# Patient Record
Sex: Male | Born: 1950 | Race: White | Hispanic: No | Marital: Married | Smoking: Never smoker
Health system: Southern US, Community
[De-identification: ages and names within clinical notes are randomized; demographics above are authoritative.]

## PROBLEM LIST (undated history)

## (undated) DIAGNOSIS — G4730 Sleep apnea, unspecified: Secondary | ICD-10-CM

## (undated) DIAGNOSIS — H9190 Unspecified hearing loss, unspecified ear: Secondary | ICD-10-CM

## (undated) DIAGNOSIS — I10 Essential (primary) hypertension: Secondary | ICD-10-CM

## (undated) DIAGNOSIS — K219 Gastro-esophageal reflux disease without esophagitis: Secondary | ICD-10-CM

## (undated) DIAGNOSIS — T7840XA Allergy, unspecified, initial encounter: Secondary | ICD-10-CM

## (undated) DIAGNOSIS — M1990 Unspecified osteoarthritis, unspecified site: Secondary | ICD-10-CM

## (undated) DIAGNOSIS — R5383 Other fatigue: Secondary | ICD-10-CM

## (undated) DIAGNOSIS — C801 Malignant (primary) neoplasm, unspecified: Secondary | ICD-10-CM

## (undated) DIAGNOSIS — E785 Hyperlipidemia, unspecified: Secondary | ICD-10-CM

## (undated) HISTORY — DX: Essential (primary) hypertension: I10

## (undated) HISTORY — PX: PLANTAR FASCIA SURGERY: SHX746

## (undated) HISTORY — DX: Sleep apnea, unspecified: G47.30

## (undated) HISTORY — DX: Hyperlipidemia, unspecified: E78.5

## (undated) HISTORY — DX: Unspecified hearing loss, unspecified ear: H91.90

## (undated) HISTORY — DX: Allergy, unspecified, initial encounter: T78.40XA

## (undated) HISTORY — PX: VASECTOMY: SHX75

## (undated) HISTORY — DX: Malignant (primary) neoplasm, unspecified: C80.1

## (undated) HISTORY — DX: Gastro-esophageal reflux disease without esophagitis: K21.9

## (undated) HISTORY — DX: Other fatigue: R53.83

---

## 1974-01-23 HISTORY — PX: HERNIA REPAIR: SHX51

## 1997-01-23 HISTORY — PX: TOTAL THYROIDECTOMY: SHX2547

## 1999-01-24 HISTORY — PX: TREATMENT FISTULA ANAL: SUR1390

## 2012-01-24 DIAGNOSIS — C801 Malignant (primary) neoplasm, unspecified: Secondary | ICD-10-CM

## 2012-01-24 HISTORY — DX: Malignant (primary) neoplasm, unspecified: C80.1

## 2012-10-07 HISTORY — PX: OTHER SURGICAL HISTORY: SHX169

## 2013-02-03 DIAGNOSIS — R252 Cramp and spasm: Secondary | ICD-10-CM | POA: Insufficient documentation

## 2013-02-03 DIAGNOSIS — C73 Malignant neoplasm of thyroid gland: Secondary | ICD-10-CM | POA: Insufficient documentation

## 2016-07-13 DIAGNOSIS — M545 Low back pain, unspecified: Secondary | ICD-10-CM | POA: Insufficient documentation

## 2016-07-14 DIAGNOSIS — M5416 Radiculopathy, lumbar region: Secondary | ICD-10-CM | POA: Insufficient documentation

## 2017-01-23 HISTORY — PX: SPINE SURGERY: SHX786

## 2017-03-06 DIAGNOSIS — E559 Vitamin D deficiency, unspecified: Secondary | ICD-10-CM | POA: Insufficient documentation

## 2017-05-10 DIAGNOSIS — M47816 Spondylosis without myelopathy or radiculopathy, lumbar region: Secondary | ICD-10-CM | POA: Insufficient documentation

## 2017-07-04 DIAGNOSIS — M48061 Spinal stenosis, lumbar region without neurogenic claudication: Secondary | ICD-10-CM | POA: Insufficient documentation

## 2018-04-17 ENCOUNTER — Ambulatory Visit: Payer: Self-pay | Admitting: General Surgery

## 2018-04-26 DIAGNOSIS — Z6841 Body Mass Index (BMI) 40.0 and over, adult: Secondary | ICD-10-CM | POA: Insufficient documentation

## 2018-04-26 DIAGNOSIS — K219 Gastro-esophageal reflux disease without esophagitis: Secondary | ICD-10-CM | POA: Insufficient documentation

## 2018-04-26 DIAGNOSIS — I447 Left bundle-branch block, unspecified: Secondary | ICD-10-CM | POA: Insufficient documentation

## 2018-04-26 DIAGNOSIS — R0609 Other forms of dyspnea: Secondary | ICD-10-CM | POA: Insufficient documentation

## 2018-04-26 DIAGNOSIS — I2510 Atherosclerotic heart disease of native coronary artery without angina pectoris: Secondary | ICD-10-CM | POA: Insufficient documentation

## 2018-05-01 ENCOUNTER — Other Ambulatory Visit: Payer: Self-pay

## 2018-05-01 ENCOUNTER — Encounter: Payer: Self-pay | Admitting: General Surgery

## 2018-05-01 ENCOUNTER — Ambulatory Visit (INDEPENDENT_AMBULATORY_CARE_PROVIDER_SITE_OTHER): Payer: Medicare Other | Admitting: General Surgery

## 2018-05-01 VITALS — BP 145/78 | HR 64 | Temp 97.9°F | Resp 14 | Ht 70.0 in | Wt 283.4 lb

## 2018-05-01 DIAGNOSIS — Z85850 Personal history of malignant neoplasm of thyroid: Secondary | ICD-10-CM

## 2018-05-01 NOTE — Progress Notes (Signed)
Patient ID: Chase Ellis, male   DOB: 1950-11-20, 68 y.o.   MRN: [id number]  Chief Complaint  Patient presents with  . Follow-up    thyroid cancer    HPI Chase Ellis is a 68 y.o. male.   He is a patient of mine from my previous institution.  He has a history of thyroid cancer.  He underwent total thyroidectomy with Dr. Zena Amos in 1999.  He had a local recurrence which I resected in 2014.  He had been managed since that operation without any evidence of recurrence.  His TSH was kept in the low- to just below low-normal range.  He has had very low level, detectable thyroglobulin, in the absence of antithyroglobulin antibodies.  At the time of his recurrence in 2014, the tumor was non-iodine avid.  It was, however, PET avid.  Since my departure from [facility], he has been seen by Dr. Cam Hai, who apparently felt that he did not require any further follow-up in endocrine surgery.  He was referred to see Dr. Halford Chessman in endocrinology, but has not yet had that appointment.  For about the last year, he has been evaluated for a chronic cough and elevated white blood cell count.  He has been treated with multiple rounds of antibiotics and steroids.  He was recently seen at [facility] in the oncology department by Dr. Ellwood Handler, for further evaluation of his elevated white count.  I do not have the full details of the evaluation and management, but both [name] and his wife state that his white count is better, but that his cough persists.  Some of this seems to have been attributed to an ACE inhibitor, which has been changed.  Nonetheless, he continues to have a cough throughout the day.  It is not present at night.  He was seen by Dr. Silvio Clayman in otolaryngology who did not find evidence of any upper airway disease, but stated in his note that he would refer him to laryngology for further evaluation.  I do not see that that visit ever transpired.  In the interim however, he had  several skin nodules crop up.  These were removed with a shave biopsy by a dermatologist local to him.  1 was consistent with irritated verrucoid keratosis; 1 was consistent with irritated seborrheic keratosis; the third, found in the right anterior neck was consistent with metastatic thyroid carcinoma.  With these results, Dr. Ellwood Handler referred him to see Dr. Fredricka Bonine, and otolaryngology at [facility].  Dr. Francine Graven note indicates that there was a small lesion in the area where Dr. Kathyrn Lass had left a drain many years ago.  He was concerned that this may represent additional sites of metastatic cutaneous spread.  This was removed and fortunately returned as benign.  No further notes from Dr. Conley Canal discussed the plan from that point forward, but [name] and his wife indicated that he had discussed with them a wide local excision of the skin surrounding his thyroidectomy scar as well as excision of an area within the right thyroidectomy bed that had been seen on previous imaging, but not biopsied.  Mr. Franchini and his wife felt that they would prefer to see me.  They were able to locate me using Google and made the appointment for today.  [name] denies any symptoms of thyroid excess or insufficiency.  Specifically he denies any heart palpitations, hand tremors, changes in the texture of his hair skin or fingernails.  He has not had any significant weight changes.  No diarrhea or constipation.  No heat or cold intolerance.  He is currently taking 175 mcg of levothyroxine daily.  His most recent labs demonstrate a free T4 of 1.4, thyroglobulin 0.2 with undetectable antithyroglobulin antibodies.  TSH is suppressed at 0.132.    Past Medical History:  Diagnosis Date  . Allergy   . Cancer [facility]) 2014   Thyroid  . Fatigue   . GERD (gastroesophageal reflux disease)   . Hearing loss   . Hyperlipidemia   . Hypertension   . Sleep apnea    uses CPAP    Past Surgical History:  Procedure  Laterality Date  . HERNIA REPAIR Right 1976   Inguinal  . lymph node dissecton cervical  10/07/2012  . PLANTAR FASCIA SURGERY Bilateral   . SPINE SURGERY  2019   lumbar  . TOTAL THYROIDECTOMY  [facility]   [facility]  . TREATMENT FISTULA ANAL  2001  . VASECTOMY      Family History  Problem Relation Age of Onset  . Thyroid disease Mother   . Hypertension Mother   . Stroke Father   . Heart attack Father   . Bladder Cancer Father   . Diabetes Father   . Heart attack Sister   . Diabetes Sister   . Hypercholesterolemia Sister   . Hypertension Sister     Social History Social History   Tobacco Use  . Smoking status: Never Smoker  . Smokeless tobacco: Former Network engineer Use Topics  . Alcohol use: Never    Frequency: Never  . Drug use: Never    Allergies  Allergen Reactions  . Other     Deer  . Valsartan Other (See Comments)    Cough     Current Outpatient Medications  Medication Sig Dispense Refill  . azelastine (ASTELIN) 0.1 % nasal spray U 2 SPRAYS IEN BID    . Cholecalciferol (VITAMIN D-1000 MAX ST) 25 MCG (1000 UT) tablet Take by mouth.    . fluticasone (FLONASE) 50 MCG/ACT nasal spray Place into both nostrils daily.    . hydrochlorothiazide (HYDRODIURIL) 25 MG tablet TK 1 T PO QD    . levothyroxine (SYNTHROID, LEVOTHROID) 175 MCG tablet Take 175 mcg by mouth daily before breakfast.    . metoprolol succinate (TOPROL-XL) 25 MG 24 hr tablet Take by mouth.    Marland Kitchen omeprazole (PRILOSEC) 40 MG capsule TK 1 C PO QD    . potassium chloride SA (K-DUR,KLOR-CON) 20 MEQ tablet Take by mouth.    . rosuvastatin (CRESTOR) 5 MG tablet      No current facility-administered medications for this visit.     Review of Systems Review of Systems  All other systems reviewed and are negative. Or as discussed in the history of present illness.   Blood pressure (!) 145/78, pulse 64, temperature 97.9 F (36.6 C), resp. rate 14, height 5\' 10"  (1.778 m), weight 283 lb 6.4  oz (128.5 kg), SpO2 96 %.  Physical Exam Physical Exam Vitals signs reviewed.  Constitutional:      General: He is not in acute distress.    Appearance: Normal appearance. He is obese.  HENT:     Head: Normocephalic and atraumatic.     Nose: No congestion or rhinorrhea.     Mouth/Throat:     Mouth: Mucous membranes are moist.  Eyes:     General: No scleral icterus.       Right eye: No  discharge.        Left eye: No discharge.     Comments: No proptosis or exophthalmos  Neck:     Musculoskeletal: Normal range of motion.     Comments: Thyroidectomy scar is well-healed and faded.  No masses are palpable in the thyroidectomy bed.  There is a small cutaneous lump approximately a centimeter cranial to the thyroidectomy scar.  Mr. Yamada states that this appears similar to the lesion that was shaved from off of the thyroid scar and turned out to be thyroid carcinoma. Cardiovascular:     Rate and Rhythm: Normal rate and regular rhythm.  Pulmonary:     Effort: Pulmonary effort is normal.  Abdominal:     Palpations: Abdomen is soft.  Genitourinary:    Comments: Deferred. Musculoskeletal: Normal range of motion.     Right lower leg: No edema.     Left lower leg: No edema.  Lymphadenopathy:     Cervical: No cervical adenopathy.  Skin:    General: Skin is warm and dry.     Coloration: Skin is not jaundiced.  Neurological:     General: No focal deficit present.     Mental Status: He is alert and oriented to person, place, and time.  Psychiatric:        Mood and Affect: Mood normal.        Behavior: Behavior normal.     Data Reviewed I extensively reviewed the notes from his recent experiences at [facility].  These are summarized in the history of present illness.  We were also able to obtain the biopsy report from his dermatologist.  This is also reviewed in the history of present illness.  His labs were reviewed in care everywhere and also discussed in the  history of present illness.  Assessment and Plan: This is a 68 year old man who had an initial thyroidectomy for thyroid cancer in 1999 followed by radioactive iodine ablation.  He had a recurrence in the strap muscles in 2014.  This recurrence was non-iodine avid, but was PET avid.  He recently had a shave biopsy of a skin lesion that was consistent with cutaneous metastasis of thyroid carcinoma.  There is also an area within the right thyroidectomy bed that may represent recurrence as well.  This has been seen on previous studies but was not seen on PET scan in 2014.  Chest CT performed to evaluate for cough also showed multiple small pulmonary nodules.  He is scheduled for a repeat study in May.  Before proceeding with any operative intervention, I would like to obtain a PET scan from the skull base to the mid thighs.  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  We have ordered this.  I will contact him when I have these results and we will make plans at that time.      Fredirick Maudlin 05/01/2018, 1:36 PM

## 2018-05-01 NOTE — Patient Instructions (Addendum)
You are scheduled for a PET scan at [facility] on 05/09/18 at 10:30 am.  You will need to arrive there by 10:00 am. You will need to have nothing by mouth after midnight the night prior.  You will go to the [place] entrance and only yourself may go in due to the Covid 19 virus.  We will call you with the results.

## 2018-05-08 ENCOUNTER — Ambulatory Visit: Payer: Self-pay | Admitting: General Surgery

## 2018-05-09 ENCOUNTER — Encounter
Admission: RE | Admit: 2018-05-09 | Discharge: 2018-05-09 | Disposition: A | Discharge status: Home / Self Care | Payer: Medicare Other | Source: Ambulatory Visit | Attending: General Surgery | Admitting: General Surgery

## 2018-05-09 ENCOUNTER — Other Ambulatory Visit: Payer: Self-pay

## 2018-05-09 DIAGNOSIS — Z79899 Other long term (current) drug therapy: Secondary | ICD-10-CM | POA: Insufficient documentation

## 2018-05-09 DIAGNOSIS — Z85850 Personal history of malignant neoplasm of thyroid: Secondary | ICD-10-CM | POA: Diagnosis not present

## 2018-05-09 LAB — GLUCOSE, CAPILLARY: Glucose-Capillary: 82 mg/dL (ref 70–99)

## 2018-05-09 MED ORDER — FLUDEOXYGLUCOSE F - 18 (FDG) INJECTION
15.8000 | Freq: Once | INTRAVENOUS | Status: AC | PRN
  Administered 2018-05-09: 15.8 via INTRAVENOUS

## 2018-05-15 ENCOUNTER — Telehealth: Payer: Self-pay | Admitting: General Surgery

## 2018-05-15 NOTE — Telephone Encounter (Signed)
Discussed results of PET scan.  No uptake within neck/thyroid bed.  Will plan re-excision of scar (area where cutaneous met showed up) and biopsy the other skin lesion on his neck.

## 2018-06-04 ENCOUNTER — Telehealth: Payer: Self-pay | Admitting: *Deleted

## 2018-06-04 ENCOUNTER — Other Ambulatory Visit: Payer: Self-pay | Admitting: General Surgery

## 2018-06-04 DIAGNOSIS — C799 Secondary malignant neoplasm of unspecified site: Secondary | ICD-10-CM

## 2018-06-04 DIAGNOSIS — C73 Malignant neoplasm of thyroid gland: Secondary | ICD-10-CM

## 2018-06-04 NOTE — Telephone Encounter (Signed)
Patient contacted and agreeable to having surgery at [facility].   The patient is scheduled for 06-10-18 with Dr. Celine Ahr.   Patient aware to have COVID-19 testing on 06-06-18 between 10:30 and 12:30 am at the [facility] building drive thru.   Patient aware to be NPO after midnight and have a driver.   Patient aware that he may have no visitors and driver will need to wait in the car due to COVID-19 restrictions.   The patient verbalizes understanding of the above.   The patient is aware to call the office should he have further questions.

## 2018-06-05 ENCOUNTER — Encounter: Payer: Self-pay | Admitting: *Deleted

## 2018-06-05 ENCOUNTER — Other Ambulatory Visit: Payer: Self-pay

## 2018-06-06 ENCOUNTER — Other Ambulatory Visit
Admission: RE | Admit: 2018-06-06 | Discharge: 2018-06-06 | Disposition: A | Discharge status: Home / Self Care | Payer: Medicare Other | Source: Ambulatory Visit | Attending: General Surgery | Admitting: General Surgery

## 2018-06-06 DIAGNOSIS — Z1159 Encounter for screening for other viral diseases: Secondary | ICD-10-CM | POA: Insufficient documentation

## 2018-06-07 LAB — NOVEL CORONAVIRUS, NAA (HOSP ORDER, SEND-OUT TO REF LAB; TAT 18-24 HRS): SARS-CoV-2, NAA: NOT DETECTED

## 2018-06-09 NOTE — Discharge Instructions (Signed)
General Anesthesia, Adult, Care After  This sheet gives you information about how to care for yourself after your procedure. Your health care provider may also give you more specific instructions. If you have problems or questions, contact your health care provider.  What can I expect after the procedure?  After the procedure, the following side effects are common:  Pain or discomfort at the IV site.  Nausea.  Vomiting.  Sore throat.  Trouble concentrating.  Feeling cold or chills.  Weak or tired.  Sleepiness and fatigue.  Soreness and body aches. These side effects can affect parts of the body that were not involved in surgery.  Follow these instructions at home:    For at least 24 hours after the procedure:  Have a responsible adult stay with you. It is important to have someone help care for you until you are awake and alert.  Rest as needed.  Do not:  Participate in activities in which you could fall or become injured.  Drive.  Use heavy machinery.  Drink alcohol.  Take sleeping pills or medicines that cause drowsiness.  Make important decisions or sign legal documents.  Take care of children on your own.  Eating and drinking  Follow any instructions from your health care provider about eating or drinking restrictions.  When you feel hungry, start by eating small amounts of foods that are soft and easy to digest (bland), such as toast. Gradually return to your regular diet.  Drink enough fluid to keep your urine pale yellow.  If you vomit, rehydrate by drinking water, juice, or clear broth.  General instructions  If you have sleep apnea, surgery and certain medicines can increase your risk for breathing problems. Follow instructions from your health care provider about wearing your sleep device:  Anytime you are sleeping, including during daytime naps.  While taking prescription pain medicines, sleeping medicines, or medicines that make you drowsy.  Return to your normal activities as told by your health care  provider. Ask your health care provider what activities are safe for you.  Take over-the-counter and prescription medicines only as told by your health care provider.  If you smoke, do not smoke without supervision.  Keep all follow-up visits as told by your health care provider. This is important.  Contact a health care provider if:  You have nausea or vomiting that does not get better with medicine.  You cannot eat or drink without vomiting.  You have pain that does not get better with medicine.  You are unable to pass urine.  You develop a skin rash.  You have a fever.  You have redness around your IV site that gets worse.  Get help right away if:  You have difficulty breathing.  You have chest pain.  You have blood in your urine or stool, or you vomit blood.  Summary  After the procedure, it is common to have a sore throat or nausea. It is also common to feel tired.  Have a responsible adult stay with you for the first 24 hours after general anesthesia. It is important to have someone help care for you until you are awake and alert.  When you feel hungry, start by eating small amounts of foods that are soft and easy to digest (bland), such as toast. Gradually return to your regular diet.  Drink enough fluid to keep your urine pale yellow.  Return to your normal activities as told by your health care provider. Ask your health care   provider what activities are safe for you.  This information is not intended to replace advice given to you by your health care provider. Make sure you discuss any questions you have with your health care provider.  Document Released: 04/17/2000 Document Revised: 08/25/2016 Document Reviewed: 08/25/2016  Elsevier Interactive Patient Education  2019 [employer]

## 2018-06-10 ENCOUNTER — Ambulatory Visit: Payer: Medicare Other | Admitting: Anesthesiology

## 2018-06-10 ENCOUNTER — Ambulatory Visit
Admission: RE | Admit: 2018-06-10 | Discharge: 2018-06-10 | Disposition: A | Discharge status: Home / Self Care | Payer: Medicare Other | Attending: General Surgery | Admitting: General Surgery

## 2018-06-10 ENCOUNTER — Encounter
Admission: RE | Disposition: A | Discharge status: Home / Self Care | Payer: Self-pay | Source: Home / Self Care | Attending: General Surgery

## 2018-06-10 DIAGNOSIS — Z8249 Family history of ischemic heart disease and other diseases of the circulatory system: Secondary | ICD-10-CM | POA: Insufficient documentation

## 2018-06-10 DIAGNOSIS — R221 Localized swelling, mass and lump, neck: Secondary | ICD-10-CM | POA: Diagnosis not present

## 2018-06-10 DIAGNOSIS — Z6839 Body mass index (BMI) 39.0-39.9, adult: Secondary | ICD-10-CM | POA: Insufficient documentation

## 2018-06-10 DIAGNOSIS — C799 Secondary malignant neoplasm of unspecified site: Secondary | ICD-10-CM

## 2018-06-10 DIAGNOSIS — Z79890 Hormone replacement therapy (postmenopausal): Secondary | ICD-10-CM | POA: Diagnosis not present

## 2018-06-10 DIAGNOSIS — K219 Gastro-esophageal reflux disease without esophagitis: Secondary | ICD-10-CM | POA: Insufficient documentation

## 2018-06-10 DIAGNOSIS — C7989 Secondary malignant neoplasm of other specified sites: Secondary | ICD-10-CM

## 2018-06-10 DIAGNOSIS — Z85850 Personal history of malignant neoplasm of thyroid: Secondary | ICD-10-CM | POA: Diagnosis not present

## 2018-06-10 DIAGNOSIS — Z79899 Other long term (current) drug therapy: Secondary | ICD-10-CM | POA: Diagnosis not present

## 2018-06-10 DIAGNOSIS — E785 Hyperlipidemia, unspecified: Secondary | ICD-10-CM | POA: Insufficient documentation

## 2018-06-10 DIAGNOSIS — C792 Secondary malignant neoplasm of skin: Secondary | ICD-10-CM | POA: Insufficient documentation

## 2018-06-10 DIAGNOSIS — E669 Obesity, unspecified: Secondary | ICD-10-CM | POA: Diagnosis not present

## 2018-06-10 DIAGNOSIS — L739 Follicular disorder, unspecified: Secondary | ICD-10-CM | POA: Diagnosis not present

## 2018-06-10 DIAGNOSIS — G4730 Sleep apnea, unspecified: Secondary | ICD-10-CM | POA: Insufficient documentation

## 2018-06-10 DIAGNOSIS — E890 Postprocedural hypothyroidism: Secondary | ICD-10-CM | POA: Diagnosis not present

## 2018-06-10 DIAGNOSIS — I10 Essential (primary) hypertension: Secondary | ICD-10-CM | POA: Insufficient documentation

## 2018-06-10 DIAGNOSIS — M1990 Unspecified osteoarthritis, unspecified site: Secondary | ICD-10-CM | POA: Diagnosis not present

## 2018-06-10 DIAGNOSIS — C73 Malignant neoplasm of thyroid gland: Secondary | ICD-10-CM

## 2018-06-10 DIAGNOSIS — L905 Scar conditions and fibrosis of skin: Secondary | ICD-10-CM | POA: Diagnosis not present

## 2018-06-10 HISTORY — DX: Unspecified osteoarthritis, unspecified site: M19.90

## 2018-06-10 HISTORY — PX: EXCISION MASS NECK: SHX6703

## 2018-06-10 SURGERY — EXCISION, MASS, NECK
Anesthesia: General | Site: Neck

## 2018-06-10 MED ORDER — HYDROCODONE-ACETAMINOPHEN 5-325 MG PO TABS: 1.0000 | ORAL_TABLET | Freq: Four times a day (QID) | ORAL | 0 refills | Status: DC | PRN

## 2018-06-10 MED ORDER — ACETAMINOPHEN 160 MG/5ML PO SOLN: 325.0000 mg | ORAL | Status: DC | PRN

## 2018-06-10 MED ORDER — FENTANYL CITRATE (PF) 100 MCG/2ML IJ SOLN
INTRAMUSCULAR | Status: DC | PRN
  Administered 2018-06-10: 12.5 ug via INTRAVENOUS
  Administered 2018-06-10: 50 ug via INTRAVENOUS

## 2018-06-10 MED ORDER — ACETAMINOPHEN 500 MG PO TABS
1000.0000 mg | ORAL_TABLET | ORAL | Status: AC
  Administered 2018-06-10: 1000 mg via ORAL

## 2018-06-10 MED ORDER — GLYCOPYRROLATE 0.2 MG/ML IJ SOLN
INTRAMUSCULAR | Status: DC | PRN
  Administered 2018-06-10: 0.1 mg via INTRAVENOUS

## 2018-06-10 MED ORDER — ONDANSETRON HCL 4 MG/2ML IJ SOLN
INTRAMUSCULAR | Status: DC | PRN
  Administered 2018-06-10: 4 mg via INTRAVENOUS

## 2018-06-10 MED ORDER — BUPIVACAINE HCL 0.25 % IJ SOLN
INTRAMUSCULAR | Status: DC | PRN
  Administered 2018-06-10: 3.5 mL

## 2018-06-10 MED ORDER — MIDAZOLAM HCL 5 MG/5ML IJ SOLN
INTRAMUSCULAR | Status: DC | PRN
  Administered 2018-06-10: 2 mg via INTRAVENOUS

## 2018-06-10 MED ORDER — LIDOCAINE-EPINEPHRINE 1 %-1:100000 IJ SOLN
INTRAMUSCULAR | Status: DC | PRN
  Administered 2018-06-10: 3.5 mL

## 2018-06-10 MED ORDER — LIDOCAINE HCL (CARDIAC) PF 100 MG/5ML IV SOSY
PREFILLED_SYRINGE | INTRAVENOUS | Status: DC | PRN
  Administered 2018-06-10: 40 mg via INTRATRACHEAL

## 2018-06-10 MED ORDER — ACETAMINOPHEN 325 MG PO TABS: 325.0000 mg | ORAL_TABLET | ORAL | Status: DC | PRN

## 2018-06-10 MED ORDER — FENTANYL CITRATE (PF) 100 MCG/2ML IJ SOLN: 25.0000 ug | INTRAMUSCULAR | Status: DC | PRN

## 2018-06-10 MED ORDER — PROPOFOL 10 MG/ML IV BOLUS
INTRAVENOUS | Status: DC | PRN
  Administered 2018-06-10: 130 mg via INTRAVENOUS

## 2018-06-10 MED ORDER — LACTATED RINGERS IV SOLN
10.0000 mL/h | INTRAVENOUS | Status: DC
  Administered 2018-06-10: 12:00:00 via INTRAVENOUS

## 2018-06-10 MED ORDER — OXYCODONE HCL 5 MG PO TABS
5.0000 mg | ORAL_TABLET | Freq: Once | ORAL | Status: AC | PRN
  Administered 2018-06-10: 5 mg via ORAL

## 2018-06-10 MED ORDER — DEXAMETHASONE SODIUM PHOSPHATE 4 MG/ML IJ SOLN
INTRAMUSCULAR | Status: DC | PRN
  Administered 2018-06-10: 4 mg via INTRAVENOUS

## 2018-06-10 MED ORDER — OXYCODONE HCL 5 MG/5ML PO SOLN: 5.0000 mg | Freq: Once | ORAL | Status: AC | PRN

## 2018-06-10 SURGICAL SUPPLY — 19 items
BLADE CLIPPER SURG (BLADE) ×2 IMPLANT
DECANTER SPIKE VIAL GLASS SM (MISCELLANEOUS) ×2 IMPLANT
DRAPE EENT SPLIT AURORA (DRAPES) ×2 IMPLANT
DRAPE HEAD BAR (DRAPES) ×2 IMPLANT
ELECT CAUTERY BLADE TIP 2.5 (TIP) ×2
ELECT REM PT RETURN 9FT ADLT (ELECTROSURGICAL) ×2
ELECTRODE CAUTERY BLDE TIP 2.5 (TIP) ×1 IMPLANT
ELECTRODE REM PT RTRN 9FT ADLT (ELECTROSURGICAL) ×1 IMPLANT
NEEDLE HYPO 25GX1X1/2 BEV (NEEDLE) ×2 IMPLANT
PACK BASIN MINOR ARMC (MISCELLANEOUS) ×2 IMPLANT
PENCIL SMOKE EVACUATOR (MISCELLANEOUS) ×2 IMPLANT
SCALPEL PROTECTED #15 DISP (BLADE) ×4 IMPLANT
SUT MNCRL 4-0 (SUTURE) ×2
SUT MNCRL 4-0 27XMFL (SUTURE) ×2
SUT VIC AB 3-0 SH 27 (SUTURE) ×1
SUT VIC AB 3-0 SH 27X BRD (SUTURE) ×1 IMPLANT
SUTURE MNCRL 4-0 27XMF (SUTURE) ×2 IMPLANT
SYR 10ML LL (SYRINGE) ×2 IMPLANT
SYR BULB IRRIG 60ML STRL (SYRINGE) ×2 IMPLANT

## 2018-06-10 NOTE — H&P (Signed)
Chief Complaint  Patient presents with  . Follow-up    thyroid cancer    HPI Chase Ellis is a 68 y.o. male.   He is a patient of mine from my previous institution.  He has a history of thyroid cancer.  He underwent total thyroidectomy with Dr. Zena Amos in 1999.  He had a local recurrence which I resected in 2014.  He had been managed since that operation without any evidence of recurrence.  His TSH was kept in the low- to just below low-normal range.  He has had very low level, detectable thyroglobulin, in the absence of antithyroglobulin antibodies.  At the time of his recurrence in 2014, the tumor was non-iodine avid.  It was, however, PET avid.  Since my departure from [facility], he has been seen by Dr. Cam Hai, who apparently felt that he did not require any further follow-up in endocrine surgery.  He was referred to see Dr. Halford Chessman in endocrinology, but has not yet had that appointment.  For about the last year, he has been evaluated for a chronic cough and elevated white blood cell count.  He has been treated with multiple rounds of antibiotics and steroids.  He was recently seen at [facility] in the oncology department by Dr. Ellwood Handler, for further evaluation of his elevated white count.  I do not have the full details of the evaluation and management, but both [name] and his wife state that his white count is better, but that his cough persists.  Some of this seems to have been attributed to an ACE inhibitor, which has been changed.  Nonetheless, he continues to have a cough throughout the day.  It is not present at night.  He was seen by Dr. Silvio Clayman in otolaryngology who did not find evidence of any upper airway disease, but stated in his note that he would refer him to laryngology for further evaluation.  I do not see that that visit ever transpired.  In the interim however, he had several skin nodules crop up.  These were removed with a shave  biopsy by a dermatologist local to him.  1 was consistent with irritated verrucoid keratosis; 1 was consistent with irritated seborrheic keratosis; the third, found in the right anterior neck was consistent with metastatic thyroid carcinoma.  With these results, Dr. Ellwood Handler referred him to see Dr. Fredricka Bonine, and otolaryngology at [facility].  Dr. Francine Graven note indicates that there was a small lesion in the area where Dr. Kathyrn Lass had left a drain many years ago.  He was concerned that this may represent additional sites of metastatic cutaneous spread.  This was removed and fortunately returned as benign.  No further notes from Dr. Conley Canal discussed the plan from that point forward, but [name] and his wife indicated that he had discussed with them a wide local excision of the skin surrounding his thyroidectomy scar as well as excision of an area within the right thyroidectomy bed that had been seen on previous imaging, but not biopsied.  Mr. Buttery and his wife felt that they would prefer to see me.  They were able to locate me using Google and made the appointment for today.  [name] denies any symptoms of thyroid excess or insufficiency.  Specifically he denies any heart palpitations, hand tremors, changes in the texture of his hair skin or fingernails.  He has not had any significant weight changes.  No diarrhea or constipation.  No  heat or cold intolerance.  He is currently taking 175 mcg of levothyroxine daily.  His most recent labs demonstrate a free T4 of 1.4, thyroglobulin 0.2 with undetectable antithyroglobulin antibodies.  TSH is suppressed at 0.132.        Past Medical History:  Diagnosis Date  . Allergy   . Cancer [facility]) 2014   Thyroid  . Fatigue   . GERD (gastroesophageal reflux disease)   . Hearing loss   . Hyperlipidemia   . Hypertension   . Sleep apnea    uses CPAP         Past Surgical History:  Procedure Laterality Date  . HERNIA REPAIR Right  1976   Inguinal  . lymph node dissecton cervical  10/07/2012  . PLANTAR FASCIA SURGERY Bilateral   . SPINE SURGERY  2019   lumbar  . TOTAL THYROIDECTOMY  [facility]   [facility]  . TREATMENT FISTULA ANAL  2001  . VASECTOMY           Family History  Problem Relation Age of Onset  . Thyroid disease Mother   . Hypertension Mother   . Stroke Father   . Heart attack Father   . Bladder Cancer Father   . Diabetes Father   . Heart attack Sister   . Diabetes Sister   . Hypercholesterolemia Sister   . Hypertension Sister     Social History Social History        Tobacco Use  . Smoking status: Never Smoker  . Smokeless tobacco: Former Network engineer Use Topics  . Alcohol use: Never    Frequency: Never  . Drug use: Never         Allergies  Allergen Reactions  . Other     Deer  . Valsartan Other (See Comments)    Cough           Current Outpatient Medications  Medication Sig Dispense Refill  . azelastine (ASTELIN) 0.1 % nasal spray U 2 SPRAYS IEN BID    . Cholecalciferol (VITAMIN D-1000 MAX ST) 25 MCG (1000 UT) tablet Take by mouth.    . fluticasone (FLONASE) 50 MCG/ACT nasal spray Place into both nostrils daily.    . hydrochlorothiazide (HYDRODIURIL) 25 MG tablet TK 1 T PO QD    . levothyroxine (SYNTHROID, LEVOTHROID) 175 MCG tablet Take 175 mcg by mouth daily before breakfast.    . metoprolol succinate (TOPROL-XL) 25 MG 24 hr tablet Take by mouth.    Marland Kitchen omeprazole (PRILOSEC) 40 MG capsule TK 1 C PO QD    . potassium chloride SA (K-DUR,KLOR-CON) 20 MEQ tablet Take by mouth.    . rosuvastatin (CRESTOR) 5 MG tablet      No current facility-administered medications for this visit.     Review of Systems Review of Systems  All other systems reviewed and are negative. Or as discussed in the history of present illness.   Vitals:   06/10/18 1111  BP: (!) 164/95  Pulse: 68  Temp: 98.1 F (36.7 C)   SpO2: 98%    Physical Exam Vitals signs reviewed.  Constitutional:      General: He is not in acute distress.    Appearance: Normal appearance. He is obese.  HENT:     Head: Normocephalic and atraumatic.     Nose: No congestion or rhinorrhea.     Mouth/Throat:     Mouth: Mucous membranes are moist.  Eyes:     General: No scleral icterus.  Right eye: No discharge.        Left eye: No discharge.     Comments: No proptosis or exophthalmos  Neck:     Musculoskeletal: Normal range of motion.     Comments: Thyroidectomy scar is well-healed and faded.  No masses are palpable in the thyroidectomy bed.  There is a small cutaneous lump approximately a centimeter cranial to the thyroidectomy scar.  Mr. Mangiaracina states that this appears similar to the lesion that was shaved from off of the thyroid scar and turned out to be thyroid carcinoma. Cardiovascular:     Rate and Rhythm: Normal rate and regular rhythm.  Pulmonary:     Effort: Pulmonary effort is normal.  Abdominal:     Palpations: Abdomen is soft.  Genitourinary:    Comments: Deferred. Musculoskeletal: Normal range of motion.     Right lower leg: No edema.     Left lower leg: No edema.  Lymphadenopathy:     Cervical: No cervical adenopathy.  Skin:    General: Skin is warm and dry.     Coloration: Skin is not jaundiced.  Neurological:     General: No focal deficit present.     Mental Status: He is alert and oriented to person, place, and time.  Psychiatric:        Mood and Affect: Mood normal.        Behavior: Behavior normal.     Data Reviewed I extensively reviewed the notes from his recent experiences at [facility].  These are summarized in the history of present illness.  We were also able to obtain the biopsy report from his dermatologist.  This is also reviewed in the history of present illness.  His labs were reviewed in care everywhere and also discussed in the history of present  illness.  Assessment and Plan: This is a 68 year old man who had an initial thyroidectomy for thyroid cancer in 1999 followed by radioactive iodine ablation.  He had a recurrence in the strap muscles in 2014.  This recurrence was non-iodine avid, but was PET avid.  He recently had a shave biopsy of a skin lesion that was consistent with cutaneous metastasis of thyroid carcinoma.  There is also an area within the right thyroidectomy bed that may represent recurrence as well.  This has been seen on previous studies but was not seen on PET scan in 2014.  Chest CT performed to evaluate for cough also showed multiple small pulmonary nodules.  He is scheduled for a repeat study in May.  There was no increased PET uptake seen on a scan done at the beginning of April, nonetheless, I think we should proceed with re-excision of the surgical scar and biopsy of the small nodules surrounding it.  I think he will need radiation therapy for local control.  Today, we proceed with the surgical procedure above.

## 2018-06-10 NOTE — Anesthesia Postprocedure Evaluation (Signed)
Anesthesia Post Note  Patient: Chase Ellis  Procedure(s) Performed: RE EXCISION SURGICAL SCAR WITH BIOSPY SKIN LESIONS (N/A Neck)  Patient location during evaluation: PACU Anesthesia Type: General Level of consciousness: awake Pain management: pain level controlled Vital Signs Assessment: post-procedure vital signs reviewed and stable Respiratory status: respiratory function stable Cardiovascular status: stable Postop Assessment: no signs of nausea or vomiting Anesthetic complications: no    Veda Canning

## 2018-06-10 NOTE — Op Note (Signed)
Operative Note  Preoperative Diagnosis:  Metastatic thyroid cancer  Postoperative Diagnosis:  metastatic thyroid cancer  Operation: Reexcision of surgical scar with known metastatic involvement.  Excision of 2 cutaneous lesions adjacent to the scar.  Surgeon: Fredirick Maudlin, MD  Assistant: None  Anesthesia: General via LMA  Findings: No visible gross involvement of the surgical scar, suggesting prior complete excision.  Indications: Chase Ellis is a 68 year old man with a past medical history notable for thyroid cancer, status post total thyroidectomy and radioactive iodine ablation..  He had a recurrence in the strap muscles in 2014 and underwent excision and central compartment lymph node dissection with me at that time.  He recently had a skin lesion biopsied at the dermatologist, which returned as consistent with metastatic thyroid carcinoma.  The lesion was within his prior surgical scar.  He also has 2 additional skin nodules adjacent to a scar that he says appear similar to the lesion that was biopsied by the dermatologist.  Procedure In Detail: The patient was identified in the preoperative holding area and brought to the operating room where his placed supine on the OR table.  Bony prominences were padded and bilateral sequential compression devices were placed on the lower extremities.  General anesthesia was induced with a laryngeal mask airway.  Patient was then positioned appropriately for the operation and sterilely prepped and draped in standard fashion.  A timeout was performed confirming his identity, the procedure being performed, his allergies, all necessary equipment was available, and that maintenance anesthesia was adequate.  The 2 skin lesions just superior to the main surgical scar were excised using full-thickness elliptical incisions.  The subcutaneous fat was not excised.  I then reexcised the entirety of his prior surgical scar down to the platysma and taking  approximately 1 cm margins on each side of the scar.  This was done primarily sharply using electrocautery as needed for hemostasis.  All incision sites were then examined and confirmed to be hemostatic.  The dermal layer incision was closed with interrupted 3-0 Vicryl and the skin was reapproximated with running subcuticular Monocryl.  The skin was cleaned and Dermabond and Steri-Strips were applied.  This Monocryl suture was trimmed at the skin surface.  The patient was awakened, extubated, and taken to the postanesthesia care unit in good condition.  EBL: <5cc  IVF:   Specimen(s): 1. Skin lesion 1: The more medial lesion overlying the trachea.     2. Skin lesion 2: To the right of midline   3.  Reexcision of thyroidectomy scar All specimen sent for permanent evaluation in formalin  Complications: none immediately apparent.   Counts: all needles, instruments, and sponges were counted and reported to be correct in number at the end of the case.   I was present for and participated in the entire operation.  Fredirick Maudlin  1:22 PM

## 2018-06-10 NOTE — Transfer of Care (Signed)
Immediate Anesthesia Transfer of Care Note  Patient: Chase Ellis  Procedure(s) Performed: RE EXCISION SURGICAL SCAR WITH BIOSPY SKIN LESIONS (N/A Neck)  Patient Location: PACU  Anesthesia Type: General  Level of Consciousness: awake, alert  and patient cooperative  Airway and Oxygen Therapy: Patient Spontanous Breathing and Patient connected to supplemental oxygen  Post-op Assessment: Post-op Vital signs reviewed, Patient's Cardiovascular Status Stable, Respiratory Function Stable, Patent Airway and No signs of Nausea or vomiting  Post-op Vital Signs: Reviewed and stable  Complications: No apparent anesthesia complications

## 2018-06-10 NOTE — Anesthesia Procedure Notes (Signed)
Procedure Name: LMA Insertion Date/Time: 06/10/2018 12:22 PM Performed by: Mayme Genta, CRNA Pre-anesthesia Checklist: Patient identified, Emergency Drugs available, Suction available, Timeout performed and Patient being monitored Patient Re-evaluated:Patient Re-evaluated prior to induction Oxygen Delivery Method: Circle system utilized Preoxygenation: Pre-oxygenation with 100% oxygen Induction Type: IV induction LMA: LMA inserted LMA Size: 4.0 Number of attempts: 1 Placement Confirmation: positive ETCO2 and breath sounds checked- equal and bilateral Tube secured with: Tape

## 2018-06-10 NOTE — Anesthesia Preprocedure Evaluation (Signed)
Anesthesia Evaluation  Patient identified by MRN, date of birth, ID band Patient awake    Reviewed: Allergy & Precautions, NPO status , Patient's Chart, lab work & pertinent test results  Airway Mallampati: II  TM Distance: >3 FB     Dental   Pulmonary sleep apnea ,    breath sounds clear to auscultation       Cardiovascular hypertension,  Rhythm:Regular Rate:Normal  HLD   Neuro/Psych    GI/Hepatic GERD  ,  Endo/Other  Hx thyroid cancer  Obesity - BMI 39  Renal/GU      Musculoskeletal  (+) Arthritis ,   Abdominal   Peds  Hematology   Anesthesia Other Findings   Reproductive/Obstetrics                             Anesthesia Physical Anesthesia Plan  ASA: III  Anesthesia Plan: General   Post-op Pain Management:    Induction: Intravenous  PONV Risk Score and Plan: 2 and Ondansetron and Dexamethasone  Airway Management Planned:   Additional Equipment:   Intra-op Plan:   Post-operative Plan:   Informed Consent: I have reviewed the patients History and Physical, chart, labs and discussed the procedure including the risks, benefits and alternatives for the proposed anesthesia with the patient or authorized representative who has indicated his/her understanding and acceptance.       Plan Discussed with: CRNA  Anesthesia Plan Comments:         Anesthesia Quick Evaluation

## 2018-06-11 ENCOUNTER — Encounter: Payer: Self-pay | Admitting: General Surgery

## 2018-06-12 ENCOUNTER — Other Ambulatory Visit: Payer: Self-pay | Admitting: Anatomic Pathology & Clinical Pathology

## 2018-06-12 LAB — SURGICAL PATHOLOGY

## 2018-06-19 ENCOUNTER — Other Ambulatory Visit: Payer: Self-pay

## 2018-06-19 ENCOUNTER — Encounter: Payer: Self-pay | Admitting: General Surgery

## 2018-06-19 ENCOUNTER — Ambulatory Visit (INDEPENDENT_AMBULATORY_CARE_PROVIDER_SITE_OTHER): Payer: Medicare Other | Admitting: General Surgery

## 2018-06-19 VITALS — BP 171/98 | HR 73 | Temp 97.7°F | Ht 70.0 in | Wt 276.0 lb

## 2018-06-19 DIAGNOSIS — C73 Malignant neoplasm of thyroid gland: Secondary | ICD-10-CM

## 2018-06-19 DIAGNOSIS — C799 Secondary malignant neoplasm of unspecified site: Secondary | ICD-10-CM

## 2018-06-19 NOTE — Progress Notes (Signed)
Chase Ellis is here today for a postoperative visit after undergoing excision of 2 cutaneous lesions and reexcision of his surgical scar.  He is a 68 year old man who has a history of thyroid cancer.  He initially underwent resection with radioactive iodine treatment with subsequent recurrence in th the strap muscles.  I operated on him for this in 2014.  At that time, his tumor was non-iodine avid and was not producing significant amounts of thyroglobulin.  A shave biopsy of a cutaneous lesion was consistent with metastatic papillary thyroid carcinoma.  I excised this area along with another cutaneous lesion in the patient's surgical scar on Monday, 18 May.  Final pathology is as follows:  PATHOLOGY Surgical Pathology  CASE: [id number]  PATIENT: Chase Ellis  Surgical Pathology Report      SPECIMEN SUBMITTED:  A. Skin lesion 1, neck, medial overlying trachea; excisional biopsy  B. Skin lesion 2,, neck, right of midline; excisional biopsy  C. Skin and soft tissue, thyroidectomy scar; re-excision   CLINICAL HISTORY:  History of papillary thyroid carcinoma (PTC), post thyroidectomy and  RAIA in 1999, and excision of local recurrence in neck in 2014. Now with  biopsy-proven PTC involving skin of neck in area of surgical scar.   PRE-OPERATIVE DIAGNOSIS:  Cutaneous thyroid cancer metastasis   POST-OPERATIVE DIAGNOSIS:  Same as pre-op      DIAGNOSIS:  A. SKIN LESION 1, NECK, MEDIAL OVERLYING TRACHEA; EXCISIONAL BIOPSY:  - SUPERFICIAL FOLLICULITIS.  - NEGATIVE FOR MALIGNANCY (TISSUE EXAMINED EXHAUSTIVELY AT MULTIPLE  LEVELS).   B. SKIN LESION 2, NECK, RIGHT OF MIDLINE; EXCISIONAL BIOPSY:  - METASTATIC PAPILLARY THYROID CARCINOMA INVOLVING DEEP DERMIS.  - SCAR CONSISTENT WITH RECENT PREVIOUS BIOPSY.  - MARGINS ARE NARROWLY CLEAR. CARCINOMA IS 0.25 MM FROM THE DEEP MARGIN.    C. SKIN AND SOFT TISSUE, THYROIDECTOMY SCAR; RE-EXCISION:  - NEGATIVE FOR MALIGNANCY.  - SCAR OF SKIN AND  SUBCUTANEOUS TISSUE, WITH ENTRAPPED SKELETAL MUSCLE  FIBERS AT DEEP EDGE.      He has done well since his operation.  No complaints or concerns.  Vitals:   06/19/18 1140  BP: (!) 171/98  Pulse: 73  Temp: 97.7 F (36.5 C)  SpO2: 99%   Focused neck exam: The Steri-Strips were removed and reveal well approximated incisions.  There is no erythema induration or drainage present.  No healing ridge identified.  Pression and plan: Chase Ellis is a 68 year old man who is now had 2 recurrences of previously treated thyroid cancer.  Cutaneous metastases are extremely rare and raise the concern for additional sites of disseminated disease.  He does have lung nodules imaged on a recent CT scan.  These were too small to characterize at the time.  He does not have any PET avid lesions in the lungs, and as previously stated, his thyroid cancer is no longer iodine avid.  He will be discussed at our institutional tumor board tomorrow.  My plan is to refer him to radiation oncology for external beam therapy with a goal of local control.  Certainly, we will need to monitor his lung nodules as potential additional sites of thyroid cancer metastases.  These may be managed with radiofrequency ablation or a tyrosine kinase inhibitor in the future, but we will defer that decision for now.  I will contact Chase Ellis and his wife with the results of our tumor board discussion.  Barring any additional interventions on my part, I will plan to see him in my clinic in 6 months time  for routine surveillance.  I anticipate that we he will of completed his radiation therapy prior to that time.

## 2018-06-20 ENCOUNTER — Other Ambulatory Visit: Payer: PRIVATE HEALTH INSURANCE

## 2018-06-20 NOTE — Progress Notes (Signed)
Tumor Board Documentation  Chase Ellis was presented by Dr Celine Ahr at our Tumor Board on 06/20/2018, which included representatives from medical oncology, radiation oncology, surgical, radiology, pathology, navigation, genetics, internal medicine, research, palliative care, pulmonology.  Ajani currently presents as an external consult, for [place], for discussion, for new positive pathology with history of the following treatments: active survellience.  Additionally, we reviewed previous medical and familial history, history of present illness, and recent lab results along with all available histopathologic and imaging studies. The tumor board considered available treatment options and made the following recommendations: Radiation therapy (primary modality) Refer to Pulmanology to monitor Pulm nodules, Refer to Radiation Oncology for External Beam Radiation Therapy  The following procedures/referrals were also placed: No orders of the defined types were placed in this encounter.   Clinical Trial Status: not discussed   Staging used: AJCC Stage Group  AJCC Staging:       Group: Metastatic Papillary Carcinoma of Thyroid   National site-specific guidelines NCCN were discussed with respect to the case.  Tumor board is a meeting of clinicians from various specialty areas who evaluate and discuss patients for whom a multidisciplinary approach is being considered. Final determinations in the plan of care are those of the provider(s). The responsibility for follow up of recommendations given during tumor board is that of the provider.   Today's extended care, comprehensive team conference, Haris was not present for the discussion and was not examined.   Multidisciplinary Tumor Board is a multidisciplinary case peer review process.  Decisions discussed in the Multidisciplinary Tumor Board reflect the opinions of the specialists present at the conference without having examined the patient.  Ultimately,  treatment and diagnostic decisions rest with the primary provider(s) and the patient.

## 2018-06-21 ENCOUNTER — Other Ambulatory Visit: Payer: Self-pay | Admitting: General Surgery

## 2018-06-21 ENCOUNTER — Telehealth: Payer: Self-pay | Admitting: General Surgery

## 2018-06-21 DIAGNOSIS — C73 Malignant neoplasm of thyroid gland: Secondary | ICD-10-CM

## 2018-06-21 DIAGNOSIS — C799 Secondary malignant neoplasm of unspecified site: Secondary | ICD-10-CM

## 2018-06-21 NOTE — Telephone Encounter (Signed)
Spoke w/pt and wife about Tumor Board discussion.  Will arrange consultation with radiation oncology to discuss EBRT for local control and with pulmonology to monitor lung nodules. I will continue to follow thyroid cancer and manage LT4 replacement.

## 2018-06-24 ENCOUNTER — Other Ambulatory Visit: Payer: Self-pay

## 2018-06-25 ENCOUNTER — Ambulatory Visit
Admission: RE | Admit: 2018-06-25 | Discharge: 2018-06-25 | Disposition: A | Discharge status: Home / Self Care | Payer: Medicare Other | Source: Ambulatory Visit | Attending: Radiation Oncology | Admitting: Radiation Oncology

## 2018-06-25 ENCOUNTER — Other Ambulatory Visit: Payer: Self-pay

## 2018-06-25 ENCOUNTER — Encounter: Payer: Self-pay | Admitting: Radiation Oncology

## 2018-06-25 VITALS — BP 169/97 | HR 62 | Temp 97.0°F | Resp 18 | Wt 277.1 lb

## 2018-06-25 DIAGNOSIS — D72828 Other elevated white blood cell count: Secondary | ICD-10-CM | POA: Diagnosis not present

## 2018-06-25 DIAGNOSIS — Z923 Personal history of irradiation: Secondary | ICD-10-CM | POA: Diagnosis not present

## 2018-06-25 DIAGNOSIS — R5383 Other fatigue: Secondary | ICD-10-CM | POA: Insufficient documentation

## 2018-06-25 DIAGNOSIS — R05 Cough: Secondary | ICD-10-CM | POA: Insufficient documentation

## 2018-06-25 DIAGNOSIS — C792 Secondary malignant neoplasm of skin: Secondary | ICD-10-CM | POA: Insufficient documentation

## 2018-06-25 DIAGNOSIS — K219 Gastro-esophageal reflux disease without esophagitis: Secondary | ICD-10-CM | POA: Insufficient documentation

## 2018-06-25 DIAGNOSIS — G4730 Sleep apnea, unspecified: Secondary | ICD-10-CM | POA: Insufficient documentation

## 2018-06-25 DIAGNOSIS — I10 Essential (primary) hypertension: Secondary | ICD-10-CM | POA: Diagnosis not present

## 2018-06-25 DIAGNOSIS — R911 Solitary pulmonary nodule: Secondary | ICD-10-CM | POA: Insufficient documentation

## 2018-06-25 DIAGNOSIS — Z8052 Family history of malignant neoplasm of bladder: Secondary | ICD-10-CM | POA: Diagnosis not present

## 2018-06-25 DIAGNOSIS — E785 Hyperlipidemia, unspecified: Secondary | ICD-10-CM | POA: Diagnosis not present

## 2018-06-25 DIAGNOSIS — C73 Malignant neoplasm of thyroid gland: Secondary | ICD-10-CM | POA: Insufficient documentation

## 2018-06-25 DIAGNOSIS — Z79899 Other long term (current) drug therapy: Secondary | ICD-10-CM | POA: Diagnosis not present

## 2018-06-25 DIAGNOSIS — M129 Arthropathy, unspecified: Secondary | ICD-10-CM | POA: Insufficient documentation

## 2018-06-25 NOTE — Consult Note (Signed)
NEW PATIENT EVALUATION  Name: Chase Ellis  MRN: [id number]  Date:   06/25/2018     DOB: [birth year]   This 68 y.o. male patient presents to the clinic for initial evaluation of skin involvement of metastatic papillary thyroid cancer.  REFERRING PHYSICIAN: Fredirick Maudlin, MD  CHIEF COMPLAINT:  Chief Complaint  Patient presents with  . Cancer    Initial consultation    DIAGNOSIS: The encounter diagnosis was Metastasis from thyroid cancer ([place]).   PREVIOUS INVESTIGATIONS:  PET/CT scan reviewed  pathology report reviewed Clinical notes reviewed  HPI: Patient is a 68 year old male with a history of total thyroidectomy back in 1999 for papillary thyroid carcinoma.  Patient did receive radioactive iodine after his initial resection.  He developed a recurrence in 2014 and had reexcision.  His TSH has remained low.  The recurrence in 2014 was non-iodine avid however it was seen on PET CT scan.  For the last year he is had a chronic cough and an elevated white count.  He has been worked up for his cough although in the meantime developed several skin lesions in the region of his prior thyroidectomy recent incision.  One had a shave biopsy which was consistent with metastatic thyroid carcinoma.  Patient had a PET CT scan back in April showing no soft tissue hypermetabolic activity in the neck.  There was a small hypermetabolic focus in the inferior right hilum without discernible lymphadenopathy.  Patient also had tiny pulmonary nodules not well characterized on this scan.  Patient underwent reexcision on May 18 with skin overlying the trachea showing superficial folliculitis negative for malignancy.  Skin in the right of midline showed metastatic papillary thyroid carcinoma involving the deep dermis and margins narrowly clear up 0.25 mm from deep margin.  Also skin and soft tissue of the thyroidectomy scar was negative for malignancy.  Case was presented at our weekly tumor conference and recommendation  at this time was observation of his chest and to proceed with external beam radiation therapy to the head and neck region.  Patient is seen today and doing well specifically denies dysphasia significant weight loss or pain.  PLANNED TREATMENT REGIMEN: External beam radiation therapy using IMRT treatment planning and delivery to the head and neck region  PAST MEDICAL HISTORY:  has a past medical history of Allergy, Arthritis, Cancer ([place]) (2014), Fatigue, GERD (gastroesophageal reflux disease), Hearing loss, Hyperlipidemia, Hypertension, and Sleep apnea.    PAST SURGICAL HISTORY:  Past Surgical History:  Procedure Laterality Date  . EXCISION MASS NECK N/A 06/10/2018   Procedure: RE EXCISION SURGICAL SCAR WITH BIOSPY SKIN LESIONS;  Surgeon: Chase Maudlin, MD;  Location: [place];  Service: General;  Laterality: N/A;  . HERNIA REPAIR Right 1976   Inguinal  . lymph node dissecton cervical  10/07/2012  . PLANTAR FASCIA SURGERY Bilateral   . SPINE SURGERY  2019   lumbar  . TOTAL THYROIDECTOMY  1999   [facility]  . TREATMENT FISTULA ANAL  2001  . VASECTOMY      FAMILY HISTORY: family history includes Bladder Cancer in his father; Diabetes in his father and sister; Heart attack in his father and sister; Hypercholesterolemia in his sister; Hypertension in his mother and sister; Stroke in his father; Thyroid disease in his mother.  SOCIAL HISTORY:  reports that he has never smoked. He quit smokeless tobacco use about 21 years ago. He reports that he does not drink alcohol or use drugs.  ALLERGIES: Other and Valsartan  MEDICATIONS:  Current Outpatient Medications  Medication Sig Dispense Refill  . albuterol (VENTOLIN HFA) 108 (90 Base) MCG/ACT inhaler Inhale into the lungs every 6 (six) hours as needed for wheezing or shortness of breath.    Marland Kitchen azelastine (ASTELIN) 0.1 % nasal spray U 2 SPRAYS IEN BID    . Cholecalciferol (VITAMIN D-1000 MAX ST) 25 MCG (1000 UT) tablet  Take by mouth.    . fluticasone (FLONASE) 50 MCG/ACT nasal spray Place into both nostrils daily.    . hydrochlorothiazide (HYDRODIURIL) 25 MG tablet TK 1 T PO QD    . HYDROcodone-acetaminophen (NORCO/VICODIN) 5-325 MG tablet Take 1 tablet by mouth every 6 (six) hours as needed for moderate pain. 15 tablet 0  . levothyroxine (SYNTHROID, LEVOTHROID) 175 MCG tablet Take 175 mcg by mouth daily before breakfast.    . metoprolol succinate (TOPROL-XL) 25 MG 24 hr tablet Take by mouth.    Marland Kitchen omeprazole (PRILOSEC) 40 MG capsule TK 1 C PO QD    . potassium chloride SA (K-DUR,KLOR-CON) 20 MEQ tablet Take by mouth.    . rosuvastatin (CRESTOR) 5 MG tablet      No current facility-administered medications for this encounter.     ECOG PERFORMANCE STATUS:  0 - Asymptomatic  REVIEW OF SYSTEMS: Patient denies any weight loss, fatigue, weakness, fever, chills or night sweats. Patient denies any loss of vision, blurred vision. Patient denies any ringing  of the ears or hearing loss. No irregular heartbeat. Patient denies heart murmur or history of fainting. Patient denies any chest pain or pain radiating to her upper extremities. Patient denies any shortness of breath, difficulty breathing at night, cough or hemoptysis. Patient denies any swelling in the lower legs. Patient denies any nausea vomiting, vomiting of blood, or coffee ground material in the vomitus. Patient denies any stomach pain. Patient states has had normal bowel movements no significant constipation or diarrhea. Patient denies any dysuria, hematuria or significant nocturia. Patient denies any problems walking, swelling in the joints or loss of balance. Patient denies any skin changes, loss of hair or loss of weight. Patient denies any excessive worrying or anxiety or significant depression. Patient denies any problems with insomnia. Patient denies excessive thirst, polyuria, polydipsia. Patient denies any swollen glands, patient denies easy bruising or  easy bleeding. Patient denies any recent infections, allergies or URI. Patient "s visual fields have not changed significantly in recent time.   PHYSICAL EXAM: BP (!) 169/97 (BP Location: Left Arm, Patient Position: Sitting)   Pulse 62   Temp (!) 97 F (36.1 C) (Tympanic)   Resp 18   Wt 277 lb 1.9 oz (125.7 kg)   BMI 39.76 kg/m  The area of the thyroid scar does show some area of erythematous changes of the skin and some slight nodular consistency.  No other evidence of cervical or supraclavicular adenopathy is identified.  Well-developed well-nourished patient in NAD. HEENT reveals PERLA, EOMI, discs not visualized.  Oral cavity is clear. No oral mucosal lesions are identified. Neck is clear without evidence of cervical or supraclavicular adenopathy. Lungs are clear to A&P. Cardiac examination is essentially unremarkable with regular rate and rhythm without murmur rub or thrill. Abdomen is benign with no organomegaly or masses noted. Motor sensory and DTR levels are equal and symmetric in the upper and lower extremities. Cranial nerves II through XII are grossly intact. Proprioception is intact. No peripheral adenopathy or edema is identified. No motor or sensory levels are noted. Crude visual fields are within normal  range.  LABORATORY DATA: Pathology report reviewed    RADIOLOGY RESULTS: PET CT scan reviewed   IMPRESSION: Recurrent skin involvement of papillary thyroid carcinoma in patient with known history of recurrence status post thyroidectomy back in 3728 in 68 year old male  PLAN: At this time elected ahead with IMRT radiation therapy.  I would treat the areas of the thyroid scar with area of close margin to 6600 cGy in 33 fractions.  Remainder of the thyroid tumor bed I would treat to 5600 cGy at the same 33 fractions.  Risks and benefits of treatment including possible dysphasia from radiation esophagitis fatigue skin reaction alteration of blood counts all were discussed in detail  with the patient and his wife by phone.  They all seem to comprehend my treatment plan well.  I have personally set up and ordered CT simulation.  I base my dose on MD Anderson recommendations for external beam use in recurrent or positive margins for thyroid well differentiated carcinoma.  Patient comprehends my treatment plan well.  I would like to take this opportunity to thank you for allowing me to participate in the care of your patient.Noreene Filbert, MD

## 2018-06-28 ENCOUNTER — Other Ambulatory Visit: Payer: Self-pay

## 2018-07-01 ENCOUNTER — Ambulatory Visit
Admission: RE | Admit: 2018-07-01 | Discharge: 2018-07-01 | Disposition: A | Discharge status: Home / Self Care | Payer: Medicare Other | Source: Ambulatory Visit | Attending: Radiation Oncology | Admitting: Radiation Oncology

## 2018-07-01 ENCOUNTER — Institutional Professional Consult (permissible substitution): Payer: PRIVATE HEALTH INSURANCE | Admitting: Pulmonary Disease

## 2018-07-01 ENCOUNTER — Other Ambulatory Visit: Payer: Self-pay

## 2018-07-01 DIAGNOSIS — Z510 Encounter for antineoplastic radiation therapy: Secondary | ICD-10-CM | POA: Insufficient documentation

## 2018-07-01 DIAGNOSIS — C792 Secondary malignant neoplasm of skin: Secondary | ICD-10-CM | POA: Diagnosis not present

## 2018-07-01 DIAGNOSIS — Z85850 Personal history of malignant neoplasm of thyroid: Secondary | ICD-10-CM | POA: Diagnosis not present

## 2018-07-03 DIAGNOSIS — Z510 Encounter for antineoplastic radiation therapy: Secondary | ICD-10-CM | POA: Diagnosis not present

## 2018-07-04 ENCOUNTER — Telehealth: Payer: Self-pay | Admitting: Pulmonary Disease

## 2018-07-04 NOTE — Telephone Encounter (Signed)
Called patient for COVID-19 pre-screening for in office visit.  Have you recently traveled any where out of the local area in the last 2 weeks? NO Have you been in close contact with a person diagnosed with COVID-19 within the last 2 weeks? NO Do you currently have any of the following symptoms? If so, when did they start? Cough     Diarrhea  Joint Pain Fever      Muscle Pain  Red eyes Shortness of breath   Abdominal pain Vomiting Loss of smell    Rash   Sore Throat Headache    Weakness  Bruising or bleeding   Okay to proceed with visit 07/05/2018

## 2018-07-05 ENCOUNTER — Other Ambulatory Visit: Payer: Self-pay | Admitting: Internal Medicine

## 2018-07-05 ENCOUNTER — Encounter: Payer: Self-pay | Admitting: Pulmonary Disease

## 2018-07-05 ENCOUNTER — Ambulatory Visit (INDEPENDENT_AMBULATORY_CARE_PROVIDER_SITE_OTHER): Payer: Medicare Other | Admitting: Pulmonary Disease

## 2018-07-05 ENCOUNTER — Other Ambulatory Visit: Payer: Self-pay | Admitting: *Deleted

## 2018-07-05 ENCOUNTER — Other Ambulatory Visit: Payer: Self-pay

## 2018-07-05 VITALS — BP 160/92 | HR 67 | Temp 97.8°F | Ht 70.0 in | Wt 278.2 lb

## 2018-07-05 DIAGNOSIS — C73 Malignant neoplasm of thyroid gland: Secondary | ICD-10-CM | POA: Diagnosis not present

## 2018-07-05 DIAGNOSIS — R1310 Dysphagia, unspecified: Secondary | ICD-10-CM | POA: Diagnosis not present

## 2018-07-05 DIAGNOSIS — R0600 Dyspnea, unspecified: Secondary | ICD-10-CM | POA: Diagnosis not present

## 2018-07-05 NOTE — Progress Notes (Signed)
Subjective:    Patient ID: Chase Ellis, male    DOB: 07-19-1950, 68 y.o.   MRN: [id number]  HPI Patient is a 68 year old lifelong never smoker who presents for evaluation of a cough of 1 to 2 years duration and a "smothering sensation" present for around the same time.  The patient is kindly referred by Dr. Fredirick Maudlin.  The patient states that the cough is brought on by a sensation that he is "smothering" and that air cannot enter through his throat.  Once this happens he gets the feeling that he needs to cough and then this usually resolves the globus sensation.  He has a very complex history of thyroid cancer with prior thyroidectomy.  He had local recurrence that had to be excised previously.  Most recently he has had recurrence at the site of the prior incision.  He is to undergo radiation therapy again.  The patient is not a very forthcoming historian.  He does note that inhalers have not helped him with his breathing.  He does not note any orthopnea, paroxysmal nocturnal dyspnea, lower extremity edema, chest pain or tachypalpitations.  He does have a history of obstructive sleep apnea and uses CPAP has been doing so for 18 years.  He states that he uses 5 cm of water pressure nocturnally.  The patient also notes that previously he has tried coming off on ARB to see if his cough got better however this did not make any change in his cough.  He does not note any particular time when the cough may appear.  He does note having issues with gastroesophageal reflux for which he takes Prilosec but has not noticed that this has helped any with his symptoms.  The patient previously was followed at [facility] and was noted to have pulmonary nodules.  He had a PET CT on 09 May 2018 at this facility but the nodules could not be well characterized.  He will need a dedicated chest CT to evaluate these.   The patient's past medical, surgical and family history have been reviewed.  They are as noted.   Social history: He has previously used smokeless tobacco but quit this in January 1999.  He has never smoked cigarettes or other substances.  Review of Systems  Constitutional: Negative.   HENT: Positive for trouble swallowing.   Eyes: Negative.   Respiratory: Positive for cough, choking and shortness of breath. Negative for wheezing and stridor.   Cardiovascular: Negative.   Gastrointestinal:       Gastroesophageal reflux symptoms  Endocrine: Negative.   Genitourinary: Negative.   Musculoskeletal: Negative.   Skin: Negative.   Neurological: Negative.   Hematological: Negative.   Psychiatric/Behavioral: Negative.   All other systems reviewed and are negative.      Objective:   Physical Exam Vitals signs and nursing note reviewed.  Constitutional:      Appearance: He is well-developed. He is obese. He is not ill-appearing or toxic-appearing.  HENT:     Head: Atraumatic.     Right Ear: External ear normal.     Left Ear: External ear normal.  Eyes:     General: No scleral icterus.    Extraocular Movements: Extraocular movements intact.     Conjunctiva/sclera: Conjunctivae normal.     Pupils: Pupils are equal, round, and reactive to light.     Comments: Nose/mouth/throat not examined due to masking requirements for COVID-19  Neck:     Musculoskeletal: Neck supple.  Vascular: No JVD.     Trachea: Trachea and phonation normal.     Comments: Thyroidectomy scar noted Cardiovascular:     Rate and Rhythm: Normal rate and regular rhythm.     Pulses: Normal pulses.     Heart sounds: Normal heart sounds. No murmur.  Pulmonary:     Effort: Pulmonary effort is normal.     Breath sounds: Normal breath sounds and air entry.  Abdominal:     General: Abdomen is protuberant.     Palpations: Abdomen is soft.  Musculoskeletal: Normal range of motion.     Right lower leg: No edema.     Left lower leg: No edema.  Lymphadenopathy:     Cervical: No cervical adenopathy.  Skin:     General: Skin is warm and dry.  Neurological:     General: No focal deficit present.     Mental Status: He is alert and oriented to person, place, and time.  Psychiatric:        Mood and Affect: Affect is flat.        Speech: Speech normal.        Behavior: Behavior is cooperative.        Assessment & Plan:   1.  Dyspnea: By his description this appears to be a mechanical issue.  He does describe globus sensation triggering this and sensation of breathlessness.  This could be related to gastroesophageal reflux, or may also be related to scar tissue from prior thyroid surgery and radiation.  He has not had pulmonary function testing and a flow-volume loop can help [place] evaluate these issues.  Will order PFTs.  He understands that due to the COVID-19 there is a significant wait time for these tests.  He does not appear to have issues with bronchospasm and as a matter fact inhalers have been of no use to him.  I have recommended that he not use them.  2.  Cough: Again, this appears to be related to his globus sensation and then inability to "get a breath in" this triggers a cough reflex.  PFTs will also be helpful in delving into this issue.  For now continue management of gastroesophageal reflux with omeprazole as he is doing.  He was instructed on proper antireflux measures  3.  Dysphagia: Will obtain upper GI series.  4.  Multiple lung nodules and potential hilar abnormalities: CT chest with contrast to follow-up on these issues.  5.  Multifocal papillary thyroid carcinoma: This issue adds complexity to his management and appears to have had local recurrence.  Multiple lung nodules could also indicate pulmonary involvement.   We will see the patient in follow-up in 2 to 3 weeks time.  He is to contact us prior to that time should any new difficulties arise.  Thank you for allowing me to participate in this gentleman's care.  This chart was dictated using voice recognition software/Dragon.   Despite best efforts to proofread, errors can occur which can change the meaning.  Any change was purely unintentional.

## 2018-07-05 NOTE — Patient Instructions (Signed)
1.  We will schedule a breathing test.  2.  You will be scheduled for a CT of the chest to follow-up on lung nodules.  3.  We will order a test to check your swallowing and for potential reflux.  4.  Follow-up will be in 2 to 3 weeks time.

## 2018-07-08 ENCOUNTER — Ambulatory Visit
Admission: RE | Admit: 2018-07-08 | Discharge: 2018-07-08 | Disposition: A | Discharge status: Home / Self Care | Payer: Medicare Other | Source: Ambulatory Visit | Attending: Radiation Oncology | Admitting: Radiation Oncology

## 2018-07-08 ENCOUNTER — Other Ambulatory Visit: Payer: Self-pay

## 2018-07-09 ENCOUNTER — Other Ambulatory Visit: Payer: Self-pay

## 2018-07-09 ENCOUNTER — Ambulatory Visit
Admission: RE | Admit: 2018-07-09 | Discharge: 2018-07-09 | Disposition: A | Discharge status: Home / Self Care | Payer: Medicare Other | Source: Ambulatory Visit | Attending: Radiation Oncology | Admitting: Radiation Oncology

## 2018-07-09 DIAGNOSIS — Z510 Encounter for antineoplastic radiation therapy: Secondary | ICD-10-CM | POA: Diagnosis not present

## 2018-07-10 ENCOUNTER — Ambulatory Visit
Admission: RE | Admit: 2018-07-10 | Discharge: 2018-07-10 | Disposition: A | Discharge status: Home / Self Care | Payer: Medicare Other | Source: Ambulatory Visit | Attending: Radiation Oncology | Admitting: Radiation Oncology

## 2018-07-10 ENCOUNTER — Other Ambulatory Visit: Payer: Self-pay

## 2018-07-10 DIAGNOSIS — Z510 Encounter for antineoplastic radiation therapy: Secondary | ICD-10-CM | POA: Diagnosis not present

## 2018-07-11 ENCOUNTER — Other Ambulatory Visit: Payer: Self-pay

## 2018-07-11 ENCOUNTER — Ambulatory Visit
Admission: RE | Admit: 2018-07-11 | Discharge: 2018-07-11 | Disposition: A | Discharge status: Home / Self Care | Payer: Medicare Other | Source: Ambulatory Visit | Attending: Radiation Oncology | Admitting: Radiation Oncology

## 2018-07-11 DIAGNOSIS — Z510 Encounter for antineoplastic radiation therapy: Secondary | ICD-10-CM | POA: Diagnosis not present

## 2018-07-12 ENCOUNTER — Ambulatory Visit
Admission: RE | Admit: 2018-07-12 | Discharge: 2018-07-12 | Disposition: A | Discharge status: Home / Self Care | Payer: Medicare Other | Source: Ambulatory Visit | Attending: Radiation Oncology | Admitting: Radiation Oncology

## 2018-07-12 ENCOUNTER — Other Ambulatory Visit: Payer: Self-pay

## 2018-07-12 DIAGNOSIS — Z510 Encounter for antineoplastic radiation therapy: Secondary | ICD-10-CM | POA: Diagnosis not present

## 2018-07-15 ENCOUNTER — Ambulatory Visit
Admission: RE | Admit: 2018-07-15 | Discharge: 2018-07-15 | Disposition: A | Discharge status: Home / Self Care | Payer: Medicare Other | Source: Ambulatory Visit | Attending: Radiation Oncology | Admitting: Radiation Oncology

## 2018-07-15 ENCOUNTER — Other Ambulatory Visit: Payer: Self-pay

## 2018-07-15 DIAGNOSIS — Z510 Encounter for antineoplastic radiation therapy: Secondary | ICD-10-CM | POA: Diagnosis not present

## 2018-07-16 ENCOUNTER — Other Ambulatory Visit: Payer: Self-pay

## 2018-07-16 ENCOUNTER — Ambulatory Visit
Admission: RE | Admit: 2018-07-16 | Discharge: 2018-07-16 | Disposition: A | Discharge status: Home / Self Care | Payer: Medicare Other | Source: Ambulatory Visit | Attending: Radiation Oncology | Admitting: Radiation Oncology

## 2018-07-16 ENCOUNTER — Other Ambulatory Visit: Payer: Self-pay | Admitting: *Deleted

## 2018-07-16 ENCOUNTER — Ambulatory Visit
Admission: RE | Admit: 2018-07-16 | Discharge: 2018-07-16 | Disposition: A | Discharge status: Home / Self Care | Payer: Medicare Other | Source: Ambulatory Visit | Attending: Pulmonary Disease | Admitting: Pulmonary Disease

## 2018-07-16 DIAGNOSIS — C73 Malignant neoplasm of thyroid gland: Secondary | ICD-10-CM | POA: Diagnosis not present

## 2018-07-16 DIAGNOSIS — Z510 Encounter for antineoplastic radiation therapy: Secondary | ICD-10-CM | POA: Diagnosis not present

## 2018-07-16 LAB — POCT I-STAT CREATININE: Creatinine, Ser: 1 mg/dL (ref 0.61–1.24)

## 2018-07-16 MED ORDER — SUCRALFATE 1 G PO TABS: 1.0000 g | ORAL_TABLET | Freq: Three times a day (TID) | ORAL | 3 refills | Status: DC

## 2018-07-16 MED ORDER — IOHEXOL 300 MG/ML  SOLN
75.0000 mL | Freq: Once | INTRAMUSCULAR | Status: AC | PRN
  Administered 2018-07-16: 75 mL via INTRAVENOUS

## 2018-07-17 ENCOUNTER — Other Ambulatory Visit: Payer: Self-pay

## 2018-07-17 ENCOUNTER — Ambulatory Visit
Admission: RE | Admit: 2018-07-17 | Discharge: 2018-07-17 | Disposition: A | Discharge status: Home / Self Care | Payer: Medicare Other | Source: Ambulatory Visit | Attending: Radiation Oncology | Admitting: Radiation Oncology

## 2018-07-17 ENCOUNTER — Telehealth: Payer: Self-pay | Admitting: Pulmonary Disease

## 2018-07-17 ENCOUNTER — Inpatient Hospital Stay: Payer: Medicare Other | Attending: Radiation Oncology

## 2018-07-17 DIAGNOSIS — C792 Secondary malignant neoplasm of skin: Secondary | ICD-10-CM | POA: Insufficient documentation

## 2018-07-17 DIAGNOSIS — Z79899 Other long term (current) drug therapy: Secondary | ICD-10-CM | POA: Diagnosis not present

## 2018-07-17 DIAGNOSIS — C73 Malignant neoplasm of thyroid gland: Secondary | ICD-10-CM | POA: Insufficient documentation

## 2018-07-17 DIAGNOSIS — I10 Essential (primary) hypertension: Secondary | ICD-10-CM | POA: Diagnosis not present

## 2018-07-17 DIAGNOSIS — Z510 Encounter for antineoplastic radiation therapy: Secondary | ICD-10-CM | POA: Diagnosis not present

## 2018-07-17 LAB — CBC
HCT: 45.1 % (ref 39.0–52.0)
Hemoglobin: 15.1 g/dL (ref 13.0–17.0)
MCH: 29.1 pg (ref 26.0–34.0)
MCHC: 33.5 g/dL (ref 30.0–36.0)
MCV: 86.9 fL (ref 80.0–100.0)
Platelets: 325 K/uL (ref 150–400)
RBC: 5.19 MIL/uL (ref 4.22–5.81)
RDW: 12.5 % (ref 11.5–15.5)
WBC: 12 K/uL — ABNORMAL HIGH (ref 4.0–10.5)
nRBC: 0 % (ref 0.0–0.2)

## 2018-07-17 NOTE — Telephone Encounter (Signed)
Called pt to reschedule 07/24/2018 appt. To later date.

## 2018-07-18 ENCOUNTER — Other Ambulatory Visit: Payer: Self-pay

## 2018-07-18 ENCOUNTER — Ambulatory Visit
Admission: RE | Admit: 2018-07-18 | Discharge: 2018-07-18 | Disposition: A | Discharge status: Home / Self Care | Payer: Medicare Other | Source: Ambulatory Visit | Attending: Radiation Oncology | Admitting: Radiation Oncology

## 2018-07-18 ENCOUNTER — Telehealth: Payer: Self-pay

## 2018-07-18 DIAGNOSIS — Z510 Encounter for antineoplastic radiation therapy: Secondary | ICD-10-CM | POA: Diagnosis not present

## 2018-07-18 NOTE — Telephone Encounter (Signed)
Called and spoke to patient, he is aware of CT results. F/u apt 07/24/18. Nothing further at this time.

## 2018-07-18 NOTE — Telephone Encounter (Signed)
-----   Message from Tyler Pita, MD sent at 07/18/2018  9:43 AM EDT ----- Recommend repeat CT in 6 months. He has very small nodules("spots') too small to biopsy and likely not of concern.

## 2018-07-19 ENCOUNTER — Ambulatory Visit
Admission: RE | Admit: 2018-07-19 | Discharge: 2018-07-19 | Disposition: A | Discharge status: Home / Self Care | Payer: Medicare Other | Source: Ambulatory Visit | Attending: Radiation Oncology | Admitting: Radiation Oncology

## 2018-07-19 ENCOUNTER — Other Ambulatory Visit: Payer: Self-pay

## 2018-07-19 DIAGNOSIS — Z510 Encounter for antineoplastic radiation therapy: Secondary | ICD-10-CM | POA: Diagnosis not present

## 2018-07-22 ENCOUNTER — Other Ambulatory Visit: Payer: Self-pay

## 2018-07-22 ENCOUNTER — Ambulatory Visit
Admission: RE | Admit: 2018-07-22 | Discharge: 2018-07-22 | Disposition: A | Discharge status: Home / Self Care | Payer: Medicare Other | Source: Ambulatory Visit | Attending: Radiation Oncology | Admitting: Radiation Oncology

## 2018-07-22 DIAGNOSIS — Z510 Encounter for antineoplastic radiation therapy: Secondary | ICD-10-CM | POA: Diagnosis not present

## 2018-07-23 ENCOUNTER — Other Ambulatory Visit: Payer: Self-pay | Admitting: *Deleted

## 2018-07-23 ENCOUNTER — Other Ambulatory Visit: Payer: Self-pay

## 2018-07-23 ENCOUNTER — Ambulatory Visit
Admission: RE | Admit: 2018-07-23 | Discharge: 2018-07-23 | Disposition: A | Discharge status: Home / Self Care | Payer: Medicare Other | Source: Ambulatory Visit | Attending: Radiation Oncology | Admitting: Radiation Oncology

## 2018-07-23 ENCOUNTER — Telehealth: Payer: Self-pay | Admitting: Pulmonary Disease

## 2018-07-23 DIAGNOSIS — Z510 Encounter for antineoplastic radiation therapy: Secondary | ICD-10-CM | POA: Diagnosis not present

## 2018-07-23 MED ORDER — DEXAMETHASONE 2 MG PO TABS: 2.0000 mg | ORAL_TABLET | Freq: Two times a day (BID) | ORAL | 0 refills | Status: DC

## 2018-07-23 MED ORDER — LANSOPRAZOLE 30 MG PO CPDR: 30.0000 mg | DELAYED_RELEASE_CAPSULE | Freq: Every day | ORAL | 0 refills | Status: DC

## 2018-07-23 NOTE — Telephone Encounter (Signed)
Ok to proceed with appointment.

## 2018-07-23 NOTE — Telephone Encounter (Signed)
Called patient for COVID-19 pre-screening for in office visit.  Have you recently traveled any where out of the local area in the last 2 weeks? No  Have you been in close contact with a person diagnosed with COVID-19 or someone awaiting results within the last 2 weeks? No Do you currently have any of the following symptoms? If so, when did they start? Cough     Diarrhea   Joint Pain Fever      Muscle Pain   Red eyes Shortness of breath   Abdominal pain  Vomiting Loss of smell    Rash Sore Throat (Yes-since last week) Headache    Weakness   Bruising or bleeding  Pt states that his sore throat comes from his cancer treatment.   Okay to proceed with visit 07/24/2018

## 2018-07-24 ENCOUNTER — Other Ambulatory Visit: Payer: Self-pay

## 2018-07-24 ENCOUNTER — Ambulatory Visit
Admission: RE | Admit: 2018-07-24 | Discharge: 2018-07-24 | Disposition: A | Discharge status: Home / Self Care | Payer: Medicare Other | Source: Ambulatory Visit | Attending: Radiation Oncology | Admitting: Radiation Oncology

## 2018-07-24 ENCOUNTER — Ambulatory Visit (INDEPENDENT_AMBULATORY_CARE_PROVIDER_SITE_OTHER): Payer: Medicare Other | Admitting: Pulmonary Disease

## 2018-07-24 ENCOUNTER — Encounter: Payer: Self-pay | Admitting: Pulmonary Disease

## 2018-07-24 ENCOUNTER — Inpatient Hospital Stay: Payer: Medicare Other | Attending: Radiation Oncology

## 2018-07-24 VITALS — BP 166/94 | HR 92 | Temp 98.2°F | Ht 70.0 in | Wt 278.8 lb

## 2018-07-24 DIAGNOSIS — C73 Malignant neoplasm of thyroid gland: Secondary | ICD-10-CM | POA: Insufficient documentation

## 2018-07-24 DIAGNOSIS — C792 Secondary malignant neoplasm of skin: Secondary | ICD-10-CM | POA: Insufficient documentation

## 2018-07-24 DIAGNOSIS — R05 Cough: Secondary | ICD-10-CM

## 2018-07-24 DIAGNOSIS — Z6841 Body Mass Index (BMI) 40.0 and over, adult: Secondary | ICD-10-CM

## 2018-07-24 DIAGNOSIS — Z923 Personal history of irradiation: Secondary | ICD-10-CM | POA: Diagnosis not present

## 2018-07-24 DIAGNOSIS — Z510 Encounter for antineoplastic radiation therapy: Secondary | ICD-10-CM | POA: Insufficient documentation

## 2018-07-24 DIAGNOSIS — Z85850 Personal history of malignant neoplasm of thyroid: Secondary | ICD-10-CM | POA: Insufficient documentation

## 2018-07-24 DIAGNOSIS — R918 Other nonspecific abnormal finding of lung field: Secondary | ICD-10-CM | POA: Diagnosis not present

## 2018-07-24 DIAGNOSIS — R059 Cough, unspecified: Secondary | ICD-10-CM

## 2018-07-24 LAB — CBC
HCT: 47.1 % (ref 39.0–52.0)
Hemoglobin: 15.7 g/dL (ref 13.0–17.0)
MCH: 28.8 pg (ref 26.0–34.0)
MCHC: 33.3 g/dL (ref 30.0–36.0)
MCV: 86.3 fL (ref 80.0–100.0)
Platelets: 383 10*3/uL (ref 150–400)
RBC: 5.46 MIL/uL (ref 4.22–5.81)
RDW: 12.7 % (ref 11.5–15.5)
WBC: 20.1 10*3/uL — ABNORMAL HIGH (ref 4.0–10.5)
nRBC: 0 % (ref 0.0–0.2)

## 2018-07-24 NOTE — Patient Instructions (Signed)
You can return here as needed.  Contact our office if you develop any new or worsen symptoms.

## 2018-07-24 NOTE — Progress Notes (Signed)
Subjective:    Patient ID: Chase Ellis, male    DOB: August 01, 1950, 68 y.o.   MRN: [id number]  HPI The patient is a 68 year old lifelong never smoker who follows here after initial evaluation on 05 July 2018.  Recall that the patient has a history of thyroid cancer with prior thyroidectomy he has had local recurrence at the site of prior incision.  He initially was evaluated for a cough and a "smothering sensation" that was present for approximately 1 to 2 years duration.  The patient tells me that he felt like he had a "flap" that would close off and smother him causing the cough.  Once he would cough his symptoms would clear.  As noted he has had a recurrence of his thyroid cancer and is undergoing radiation therapy.  He notes that since he started radiation therapy (IMRT) that his cough and his sensation of smothering have totally cleared up.  He has developed some sore throat associated with his radiation treatments and is being palliated for this symptom.  Additional issues were that the patient had lung nodules noted on chest CT as well as potential hilar abnormalities.  He underwent repeat CT scan on 23 June that shows that he has multiple nodules that are less than 4 mm in size and these appear to be stable and benign in character.  They will however need follow-up with due to his thyroid cancer history.  No abnormalities were noted on the hilar areas.  I did review this scan independently and also reviewed it with the patient.  Patient voices no other complaint today.  As noted his symptoms of cough and dyspnea have resolved with radiation therapy.  During the examination the patient's wife was connected via conference call at the request of the patient.  Patient and the wife had all of her questions answered.  Patient's wife cannot be present due to COVID-19 restrictions.  Review of Systems  Constitutional: Negative.   HENT: Negative for trouble swallowing.   Eyes: Negative.   Respiratory:  Negative for cough, choking and shortness of breath.   Cardiovascular: Negative.   Gastrointestinal: Negative.   Endocrine: Negative.   Genitourinary: Negative.   Allergic/Immunologic: Negative.   Neurological: Negative.   Hematological: Negative.   All other systems reviewed and are negative.      Objective:   Physical Exam Vitals signs and nursing note reviewed.  Constitutional:      Appearance: Normal appearance. He is obese.  HENT:     Head: Normocephalic and atraumatic.     Right Ear: External ear normal.     Left Ear: External ear normal.  Eyes:     General: No scleral icterus.    Conjunctiva/sclera: Conjunctivae normal.  Neck:     Musculoskeletal: Neck supple.     Trachea: Phonation normal. No tracheal deviation.  Cardiovascular:     Rate and Rhythm: Normal rate and regular rhythm.     Heart sounds: Normal heart sounds. No murmur.  Pulmonary:     Effort: Pulmonary effort is normal.     Breath sounds: Normal breath sounds. No wheezing.  Abdominal:     General: Abdomen is flat.     Palpations: Abdomen is soft.  Musculoskeletal:     Right lower leg: No edema.     Left lower leg: No edema.  Skin:    General: Skin is warm and dry.  Neurological:     General: No focal deficit present.     Mental Status:  He is alert and oriented to person, place, and time.  Psychiatric:        Mood and Affect: Mood normal.        Behavior: Behavior normal.    CT scan of 6/23 reviewed: Mediastinal windows showed no hilar abnormalities as noted below:   Only minute 4 mm or less nodules noted unchanged from prior, as below:          Assessment & Plan:   1.  Cough: This symptom has cleared with management of his thyroid cancer recurrence, in addition the symptom of dyspnea associated with this has also resolved.  I suspect as noted previously that this was a mechanical issue due to recurrence at the site of the patient's prior thyroidectomy incision.  Patient is to follow  here on an as-needed basis but can certainly return should symptoms recur.  2.  Multiple lung nodules: These appear benign.  The previously noted hilar abnormalities are not noted on the most recent CT scan of the chest which was a contrasted study.  3.  Multifocal papillary thyroid carcinoma: This issue adds complexity to the patient's management.  Because of this diagnosis the patient will have to have close follow-up with regards to CT scanning of the chest as he is at a higher risk for metastatic disease to develop in the lung.  Recommend follow-up CT of the chest per oncology recommendations.  4.  Morbid obesity with BMI of 40: This issue adds complexity to his management.  Patient was counseled regards to sensible nutrition strategies particularly in view of issues with recurrent thyroid cancer.  Given his current issues aim should be at not gaining any further weight.  Address weight management once his issues with thyroid cancer are under control.   This chart was dictated using voice recognition software/Dragon.  Despite best efforts to proofread, errors can occur which can change the meaning.  Any change was purely unintentional.

## 2018-07-24 NOTE — Telephone Encounter (Signed)
Nothing further needed 

## 2018-07-25 ENCOUNTER — Ambulatory Visit
Admission: RE | Admit: 2018-07-25 | Discharge: 2018-07-25 | Disposition: A | Discharge status: Home / Self Care | Payer: Medicare Other | Source: Ambulatory Visit | Attending: Radiation Oncology | Admitting: Radiation Oncology

## 2018-07-25 ENCOUNTER — Other Ambulatory Visit: Payer: Self-pay

## 2018-07-25 DIAGNOSIS — Z510 Encounter for antineoplastic radiation therapy: Secondary | ICD-10-CM | POA: Diagnosis not present

## 2018-07-29 ENCOUNTER — Ambulatory Visit
Admission: RE | Admit: 2018-07-29 | Discharge: 2018-07-29 | Disposition: A | Discharge status: Home / Self Care | Payer: Medicare Other | Source: Ambulatory Visit | Attending: Radiation Oncology | Admitting: Radiation Oncology

## 2018-07-29 ENCOUNTER — Other Ambulatory Visit: Payer: Self-pay

## 2018-07-29 ENCOUNTER — Other Ambulatory Visit: Payer: Self-pay | Admitting: *Deleted

## 2018-07-29 DIAGNOSIS — Z510 Encounter for antineoplastic radiation therapy: Secondary | ICD-10-CM | POA: Diagnosis not present

## 2018-07-30 ENCOUNTER — Other Ambulatory Visit: Payer: Self-pay | Admitting: *Deleted

## 2018-07-30 ENCOUNTER — Other Ambulatory Visit: Payer: Self-pay

## 2018-07-30 ENCOUNTER — Ambulatory Visit
Admission: RE | Admit: 2018-07-30 | Discharge: 2018-07-30 | Disposition: A | Discharge status: Home / Self Care | Payer: Medicare Other | Source: Ambulatory Visit | Attending: Radiation Oncology | Admitting: Radiation Oncology

## 2018-07-30 DIAGNOSIS — Z510 Encounter for antineoplastic radiation therapy: Secondary | ICD-10-CM | POA: Diagnosis not present

## 2018-07-30 MED ORDER — LANSOPRAZOLE 30 MG PO CPDR: 30.0000 mg | DELAYED_RELEASE_CAPSULE | Freq: Every day | ORAL | 6 refills | Status: DC

## 2018-07-30 NOTE — Progress Notes (Signed)
[facility]

## 2018-07-31 ENCOUNTER — Ambulatory Visit
Admission: RE | Admit: 2018-07-31 | Discharge: 2018-07-31 | Disposition: A | Discharge status: Home / Self Care | Payer: Medicare Other | Source: Ambulatory Visit | Attending: Radiation Oncology | Admitting: Radiation Oncology

## 2018-07-31 ENCOUNTER — Inpatient Hospital Stay: Payer: Medicare Other

## 2018-07-31 ENCOUNTER — Other Ambulatory Visit: Payer: Self-pay | Admitting: *Deleted

## 2018-07-31 ENCOUNTER — Other Ambulatory Visit: Payer: Self-pay

## 2018-07-31 DIAGNOSIS — C799 Secondary malignant neoplasm of unspecified site: Secondary | ICD-10-CM

## 2018-07-31 DIAGNOSIS — Z510 Encounter for antineoplastic radiation therapy: Secondary | ICD-10-CM | POA: Diagnosis not present

## 2018-07-31 DIAGNOSIS — C73 Malignant neoplasm of thyroid gland: Secondary | ICD-10-CM | POA: Diagnosis not present

## 2018-07-31 LAB — CBC
HCT: 44.3 % (ref 39.0–52.0)
Hemoglobin: 14.7 g/dL (ref 13.0–17.0)
MCH: 29.3 pg (ref 26.0–34.0)
MCHC: 33.2 g/dL (ref 30.0–36.0)
MCV: 88.2 fL (ref 80.0–100.0)
Platelets: 320 10*3/uL (ref 150–400)
RBC: 5.02 MIL/uL (ref 4.22–5.81)
RDW: 13.2 % (ref 11.5–15.5)
WBC: 18.2 10*3/uL — ABNORMAL HIGH (ref 4.0–10.5)
nRBC: 0 % (ref 0.0–0.2)

## 2018-07-31 MED ORDER — PANTOPRAZOLE SODIUM 40 MG PO TBEC: 40.0000 mg | DELAYED_RELEASE_TABLET | Freq: Every day | ORAL | 6 refills | Status: DC

## 2018-08-01 ENCOUNTER — Other Ambulatory Visit: Payer: Self-pay

## 2018-08-01 ENCOUNTER — Ambulatory Visit
Admission: RE | Admit: 2018-08-01 | Discharge: 2018-08-01 | Disposition: A | Discharge status: Home / Self Care | Payer: Medicare Other | Source: Ambulatory Visit | Attending: Radiation Oncology | Admitting: Radiation Oncology

## 2018-08-01 DIAGNOSIS — Z510 Encounter for antineoplastic radiation therapy: Secondary | ICD-10-CM | POA: Diagnosis not present

## 2018-08-02 ENCOUNTER — Ambulatory Visit
Admission: RE | Admit: 2018-08-02 | Discharge: 2018-08-02 | Disposition: A | Discharge status: Home / Self Care | Payer: Medicare Other | Source: Ambulatory Visit | Attending: Radiation Oncology | Admitting: Radiation Oncology

## 2018-08-02 ENCOUNTER — Other Ambulatory Visit: Payer: Self-pay

## 2018-08-02 DIAGNOSIS — Z510 Encounter for antineoplastic radiation therapy: Secondary | ICD-10-CM | POA: Diagnosis not present

## 2018-08-05 ENCOUNTER — Other Ambulatory Visit: Payer: Self-pay

## 2018-08-05 ENCOUNTER — Ambulatory Visit
Admission: RE | Admit: 2018-08-05 | Discharge: 2018-08-05 | Disposition: A | Discharge status: Home / Self Care | Payer: Medicare Other | Source: Ambulatory Visit | Attending: Radiation Oncology | Admitting: Radiation Oncology

## 2018-08-05 DIAGNOSIS — Z510 Encounter for antineoplastic radiation therapy: Secondary | ICD-10-CM | POA: Diagnosis not present

## 2018-08-06 ENCOUNTER — Ambulatory Visit
Admission: RE | Admit: 2018-08-06 | Discharge: 2018-08-06 | Disposition: A | Discharge status: Home / Self Care | Payer: Medicare Other | Source: Ambulatory Visit | Attending: Radiation Oncology | Admitting: Radiation Oncology

## 2018-08-06 ENCOUNTER — Other Ambulatory Visit: Payer: Self-pay

## 2018-08-06 ENCOUNTER — Telehealth: Payer: Self-pay | Admitting: Pulmonary Disease

## 2018-08-06 DIAGNOSIS — Z510 Encounter for antineoplastic radiation therapy: Secondary | ICD-10-CM | POA: Diagnosis not present

## 2018-08-06 NOTE — Telephone Encounter (Signed)
Left message to provide pt with covid test date/time.  covid testing will be 08/09/2018 between 12:30-2:[facility]

## 2018-08-06 NOTE — Telephone Encounter (Signed)
Patient is returning phone call.  Patient phone number is [phone].

## 2018-08-06 NOTE — Telephone Encounter (Signed)
Pt is aware of covid test date/time.  Nothing further is needed.

## 2018-08-07 ENCOUNTER — Other Ambulatory Visit: Payer: Self-pay

## 2018-08-07 ENCOUNTER — Ambulatory Visit
Admission: RE | Admit: 2018-08-07 | Discharge: 2018-08-07 | Disposition: A | Discharge status: Home / Self Care | Payer: Medicare Other | Source: Ambulatory Visit | Attending: Radiation Oncology | Admitting: Radiation Oncology

## 2018-08-07 ENCOUNTER — Inpatient Hospital Stay: Payer: Medicare Other

## 2018-08-07 DIAGNOSIS — Z510 Encounter for antineoplastic radiation therapy: Secondary | ICD-10-CM | POA: Diagnosis not present

## 2018-08-07 DIAGNOSIS — C73 Malignant neoplasm of thyroid gland: Secondary | ICD-10-CM

## 2018-08-07 DIAGNOSIS — C799 Secondary malignant neoplasm of unspecified site: Secondary | ICD-10-CM

## 2018-08-07 LAB — CBC
HCT: 42.7 % (ref 39.0–52.0)
Hemoglobin: 14 g/dL (ref 13.0–17.0)
MCH: 29.3 pg (ref 26.0–34.0)
MCHC: 32.8 g/dL (ref 30.0–36.0)
MCV: 89.3 fL (ref 80.0–100.0)
Platelets: 254 10*3/uL (ref 150–400)
RBC: 4.78 MIL/uL (ref 4.22–5.81)
RDW: 14 % (ref 11.5–15.5)
WBC: 20.3 10*3/uL — ABNORMAL HIGH (ref 4.0–10.5)
nRBC: 0 % (ref 0.0–0.2)

## 2018-08-08 ENCOUNTER — Other Ambulatory Visit: Payer: Self-pay

## 2018-08-08 ENCOUNTER — Telehealth: Payer: Self-pay | Admitting: Pulmonary Disease

## 2018-08-08 ENCOUNTER — Ambulatory Visit
Admission: RE | Admit: 2018-08-08 | Discharge: 2018-08-08 | Disposition: A | Discharge status: Home / Self Care | Payer: Medicare Other | Source: Ambulatory Visit | Attending: Radiation Oncology | Admitting: Radiation Oncology

## 2018-08-08 DIAGNOSIS — Z510 Encounter for antineoplastic radiation therapy: Secondary | ICD-10-CM | POA: Diagnosis not present

## 2018-08-08 NOTE — Telephone Encounter (Signed)
Spoke with pt re covid testing location. Advised pt that he will need to come here for his covid test 3 days prior to his PFT and that we will be reaching out to him about a week prior to remind him of date and time. Pt verbalized understanding. No further actions necessary at this time.

## 2018-08-09 ENCOUNTER — Other Ambulatory Visit: Payer: Self-pay

## 2018-08-09 ENCOUNTER — Other Ambulatory Visit: Admission: RE | Admit: 2018-08-09 | Payer: PRIVATE HEALTH INSURANCE | Source: Ambulatory Visit

## 2018-08-09 ENCOUNTER — Ambulatory Visit
Admission: RE | Admit: 2018-08-09 | Discharge: 2018-08-09 | Disposition: A | Discharge status: Home / Self Care | Payer: Medicare Other | Source: Ambulatory Visit | Attending: Radiation Oncology | Admitting: Radiation Oncology

## 2018-08-09 DIAGNOSIS — Z510 Encounter for antineoplastic radiation therapy: Secondary | ICD-10-CM | POA: Diagnosis not present

## 2018-08-12 ENCOUNTER — Ambulatory Visit: Admission: RE | Admit: 2018-08-12 | Payer: Medicare Other | Source: Ambulatory Visit

## 2018-08-12 ENCOUNTER — Ambulatory Visit: Payer: Medicare Other

## 2018-08-12 ENCOUNTER — Other Ambulatory Visit: Payer: Self-pay | Admitting: *Deleted

## 2018-08-12 ENCOUNTER — Other Ambulatory Visit: Payer: Self-pay

## 2018-08-12 MED ORDER — SILVER SULFADIAZINE 1 % EX CREA: 1.0000 "application " | TOPICAL_CREAM | Freq: Two times a day (BID) | CUTANEOUS | 3 refills | Status: DC

## 2018-08-13 ENCOUNTER — Ambulatory Visit: Payer: Medicare Other

## 2018-08-13 ENCOUNTER — Ambulatory Visit: Payer: PRIVATE HEALTH INSURANCE

## 2018-08-13 DIAGNOSIS — R911 Solitary pulmonary nodule: Secondary | ICD-10-CM | POA: Insufficient documentation

## 2018-08-14 ENCOUNTER — Inpatient Hospital Stay: Payer: Medicare Other

## 2018-08-14 ENCOUNTER — Ambulatory Visit: Payer: Medicare Other

## 2018-08-15 ENCOUNTER — Ambulatory Visit: Admission: RE | Admit: 2018-08-15 | Payer: Medicare Other | Source: Ambulatory Visit

## 2018-08-15 ENCOUNTER — Other Ambulatory Visit: Payer: Self-pay

## 2018-08-15 ENCOUNTER — Ambulatory Visit: Payer: PRIVATE HEALTH INSURANCE

## 2018-08-15 ENCOUNTER — Ambulatory Visit: Payer: Medicare Other

## 2018-08-16 ENCOUNTER — Ambulatory Visit: Payer: Medicare Other

## 2018-08-19 ENCOUNTER — Other Ambulatory Visit: Payer: Self-pay

## 2018-08-19 ENCOUNTER — Ambulatory Visit
Admission: RE | Admit: 2018-08-19 | Discharge: 2018-08-19 | Disposition: A | Discharge status: Home / Self Care | Payer: Medicare Other | Source: Ambulatory Visit | Attending: Radiation Oncology | Admitting: Radiation Oncology

## 2018-08-19 DIAGNOSIS — Z510 Encounter for antineoplastic radiation therapy: Secondary | ICD-10-CM | POA: Diagnosis not present

## 2018-08-20 ENCOUNTER — Ambulatory Visit
Admission: RE | Admit: 2018-08-20 | Discharge: 2018-08-20 | Disposition: A | Discharge status: Home / Self Care | Payer: Medicare Other | Source: Ambulatory Visit | Attending: Radiation Oncology | Admitting: Radiation Oncology

## 2018-08-20 ENCOUNTER — Other Ambulatory Visit: Payer: Self-pay

## 2018-08-20 DIAGNOSIS — Z510 Encounter for antineoplastic radiation therapy: Secondary | ICD-10-CM | POA: Diagnosis not present

## 2018-08-21 ENCOUNTER — Other Ambulatory Visit: Payer: Self-pay

## 2018-08-21 ENCOUNTER — Ambulatory Visit
Admission: RE | Admit: 2018-08-21 | Discharge: 2018-08-21 | Disposition: A | Discharge status: Home / Self Care | Payer: Medicare Other | Source: Ambulatory Visit | Attending: Radiation Oncology | Admitting: Radiation Oncology

## 2018-08-21 ENCOUNTER — Inpatient Hospital Stay: Payer: Medicare Other

## 2018-08-21 DIAGNOSIS — Z510 Encounter for antineoplastic radiation therapy: Secondary | ICD-10-CM | POA: Diagnosis not present

## 2018-08-21 DIAGNOSIS — C73 Malignant neoplasm of thyroid gland: Secondary | ICD-10-CM

## 2018-08-21 DIAGNOSIS — C799 Secondary malignant neoplasm of unspecified site: Secondary | ICD-10-CM

## 2018-08-21 LAB — CBC
HCT: 45.7 % (ref 39.0–52.0)
Hemoglobin: 15.2 g/dL (ref 13.0–17.0)
MCH: 29.9 pg (ref 26.0–34.0)
MCHC: 33.3 g/dL (ref 30.0–36.0)
MCV: 90 fL (ref 80.0–100.0)
Platelets: 237 10*3/uL (ref 150–400)
RBC: 5.08 MIL/uL (ref 4.22–5.81)
RDW: 14.4 % (ref 11.5–15.5)
WBC: 17.9 10*3/uL — ABNORMAL HIGH (ref 4.0–10.5)
nRBC: 0 % (ref 0.0–0.2)

## 2018-08-22 ENCOUNTER — Ambulatory Visit
Admission: RE | Admit: 2018-08-22 | Discharge: 2018-08-22 | Disposition: A | Discharge status: Home / Self Care | Payer: Medicare Other | Source: Ambulatory Visit | Attending: Radiation Oncology | Admitting: Radiation Oncology

## 2018-08-22 ENCOUNTER — Other Ambulatory Visit: Payer: Self-pay

## 2018-08-22 DIAGNOSIS — Z510 Encounter for antineoplastic radiation therapy: Secondary | ICD-10-CM | POA: Diagnosis not present

## 2018-08-23 ENCOUNTER — Other Ambulatory Visit: Payer: Self-pay

## 2018-08-23 ENCOUNTER — Ambulatory Visit
Admission: RE | Admit: 2018-08-23 | Discharge: 2018-08-23 | Disposition: A | Discharge status: Home / Self Care | Payer: Medicare Other | Source: Ambulatory Visit | Attending: Radiation Oncology | Admitting: Radiation Oncology

## 2018-08-23 ENCOUNTER — Ambulatory Visit: Payer: Medicare Other

## 2018-08-23 DIAGNOSIS — Z510 Encounter for antineoplastic radiation therapy: Secondary | ICD-10-CM | POA: Diagnosis not present

## 2018-08-26 ENCOUNTER — Other Ambulatory Visit: Payer: Self-pay

## 2018-08-26 ENCOUNTER — Ambulatory Visit
Admission: RE | Admit: 2018-08-26 | Discharge: 2018-08-26 | Disposition: A | Discharge status: Home / Self Care | Payer: Medicare Other | Source: Ambulatory Visit | Attending: Radiation Oncology | Admitting: Radiation Oncology

## 2018-08-26 DIAGNOSIS — Z510 Encounter for antineoplastic radiation therapy: Secondary | ICD-10-CM | POA: Diagnosis not present

## 2018-08-26 DIAGNOSIS — Z85850 Personal history of malignant neoplasm of thyroid: Secondary | ICD-10-CM | POA: Diagnosis not present

## 2018-08-26 DIAGNOSIS — C792 Secondary malignant neoplasm of skin: Secondary | ICD-10-CM | POA: Diagnosis not present

## 2018-08-27 ENCOUNTER — Other Ambulatory Visit: Payer: Self-pay

## 2018-08-27 ENCOUNTER — Ambulatory Visit
Admission: RE | Admit: 2018-08-27 | Discharge: 2018-08-27 | Disposition: A | Discharge status: Home / Self Care | Payer: Medicare Other | Source: Ambulatory Visit | Attending: Radiation Oncology | Admitting: Radiation Oncology

## 2018-08-27 DIAGNOSIS — Z510 Encounter for antineoplastic radiation therapy: Secondary | ICD-10-CM | POA: Diagnosis not present

## 2018-08-28 ENCOUNTER — Ambulatory Visit
Admission: RE | Admit: 2018-08-28 | Discharge: 2018-08-28 | Disposition: A | Discharge status: Home / Self Care | Payer: Medicare Other | Source: Ambulatory Visit | Attending: Radiation Oncology | Admitting: Radiation Oncology

## 2018-08-28 ENCOUNTER — Other Ambulatory Visit: Payer: Self-pay

## 2018-08-28 ENCOUNTER — Ambulatory Visit: Payer: Medicare Other

## 2018-08-28 DIAGNOSIS — Z510 Encounter for antineoplastic radiation therapy: Secondary | ICD-10-CM | POA: Diagnosis not present

## 2018-08-29 ENCOUNTER — Other Ambulatory Visit: Payer: Self-pay

## 2018-08-29 ENCOUNTER — Ambulatory Visit: Payer: Medicare Other

## 2018-08-29 ENCOUNTER — Ambulatory Visit
Admission: RE | Admit: 2018-08-29 | Discharge: 2018-08-29 | Disposition: A | Discharge status: Home / Self Care | Payer: Medicare Other | Source: Ambulatory Visit | Attending: Radiation Oncology | Admitting: Radiation Oncology

## 2018-08-29 DIAGNOSIS — Z510 Encounter for antineoplastic radiation therapy: Secondary | ICD-10-CM | POA: Diagnosis not present

## 2018-08-30 ENCOUNTER — Ambulatory Visit
Admission: RE | Admit: 2018-08-30 | Discharge: 2018-08-30 | Disposition: A | Discharge status: Home / Self Care | Payer: Medicare Other | Source: Ambulatory Visit | Attending: Radiation Oncology | Admitting: Radiation Oncology

## 2018-08-30 ENCOUNTER — Other Ambulatory Visit: Payer: Self-pay

## 2018-08-30 DIAGNOSIS — Z510 Encounter for antineoplastic radiation therapy: Secondary | ICD-10-CM | POA: Diagnosis not present

## 2018-09-11 ENCOUNTER — Telehealth: Payer: Self-pay | Admitting: *Deleted

## 2018-09-11 NOTE — Telephone Encounter (Signed)
Patient's wife Chase Ellis called the office today.  The patient last saw Dr. Celine Ahr 06-19-18.   Wife reports TSH was 0.142 in May 2020. She states patient's result from 09-10-18 was 2.998. Wife states these results should be available to Dr. Celine Ahr via [facility].  Wife states that patient has had 2 radiation treatments and was told this should not affect TSH levels.   Patient is currently taking levothyroxine 175 mcg once daily and wants to know if this dosage needs to be changed based upon the above lab results.   Note routed to Dr. Celine Ahr.

## 2018-09-11 NOTE — Telephone Encounter (Signed)
Patient's wife contacted and notified as instructed. She verbalizes understanding.

## 2018-09-11 NOTE — Telephone Encounter (Signed)
Please have him start taking an extra tablet on SUNDAYS.  He should take 175 mg (one tablet) on Monday-Saturday and 2 tablets (total of 350 mg) on Sundays.  Thanks!

## 2018-09-18 ENCOUNTER — Telehealth: Payer: Self-pay | Admitting: Pulmonary Disease

## 2018-09-18 NOTE — Telephone Encounter (Signed)
Pt is aware that covid test can only be ordered 4 days prior to PFT.  He is aware that I will contact him next week to order test and give directions.  Nothing further is needed.

## 2018-09-24 ENCOUNTER — Telehealth: Payer: Self-pay | Admitting: Pulmonary Disease

## 2018-09-24 ENCOUNTER — Other Ambulatory Visit: Payer: Self-pay

## 2018-09-24 DIAGNOSIS — I420 Dilated cardiomyopathy: Secondary | ICD-10-CM | POA: Insufficient documentation

## 2018-09-24 NOTE — Telephone Encounter (Signed)
Pt aware of date/time of covid test.  Nothing further is needed.  

## 2018-09-25 ENCOUNTER — Other Ambulatory Visit: Payer: Self-pay

## 2018-09-25 ENCOUNTER — Other Ambulatory Visit
Admission: RE | Admit: 2018-09-25 | Discharge: 2018-09-25 | Disposition: A | Discharge status: Home / Self Care | Payer: Medicare Other | Source: Ambulatory Visit | Attending: Pulmonary Disease | Admitting: Pulmonary Disease

## 2018-09-25 DIAGNOSIS — Z20828 Contact with and (suspected) exposure to other viral communicable diseases: Secondary | ICD-10-CM | POA: Insufficient documentation

## 2018-09-25 DIAGNOSIS — Z01812 Encounter for preprocedural laboratory examination: Secondary | ICD-10-CM | POA: Insufficient documentation

## 2018-09-25 LAB — SARS CORONAVIRUS 2 (TAT 6-24 HRS): SARS Coronavirus 2: NEGATIVE

## 2018-09-26 ENCOUNTER — Other Ambulatory Visit: Payer: Self-pay

## 2018-09-26 ENCOUNTER — Ambulatory Visit (HOSPITAL_COMMUNITY): Payer: Medicare Other

## 2018-09-26 ENCOUNTER — Ambulatory Visit: Payer: Medicare Other

## 2018-09-26 ENCOUNTER — Ambulatory Visit
Admission: RE | Admit: 2018-09-26 | Discharge: 2018-09-26 | Disposition: A | Discharge status: Home / Self Care | Payer: Medicare Other | Source: Ambulatory Visit | Attending: Pulmonary Disease | Admitting: Pulmonary Disease

## 2018-09-26 DIAGNOSIS — R0600 Dyspnea, unspecified: Secondary | ICD-10-CM

## 2018-09-26 DIAGNOSIS — R1310 Dysphagia, unspecified: Secondary | ICD-10-CM | POA: Insufficient documentation

## 2018-09-26 MED ORDER — ALBUTEROL SULFATE (2.5 MG/3ML) 0.083% IN NEBU
2.5000 mg | INHALATION_SOLUTION | Freq: Once | RESPIRATORY_TRACT | Status: AC
  Administered 2018-09-26: 14:00:00 2.5 mg via RESPIRATORY_TRACT
  Filled 2018-09-26: qty 3

## 2018-09-26 NOTE — Therapy (Addendum)
[place], [place], [address] Phone: [phone]   Fax:     Modified Barium Swallow  Patient Details  Name: Chase Ellis MRN: [id number] Date of Birth: Aug 23, 1950 No data recorded  Encounter Date: 09/26/2018  End of Session - 09/26/18 1514    Visit Number  1    Number of Visits  1    Date for SLP Re-Evaluation  09/26/18    SLP Start Time  30    SLP Stop Time   1400    SLP Time Calculation (min)  60 min    Activity Tolerance  Patient tolerated treatment well       Past Medical History:  Diagnosis Date  . Allergy   . Arthritis    spine  . Cancer [facility]) 2014   Thyroid  . Fatigue   . GERD (gastroesophageal reflux disease)   . Hearing loss   . Hyperlipidemia   . Hypertension   . Sleep apnea    uses CPAP    Past Surgical History:  Procedure Laterality Date  . EXCISION MASS NECK N/A 06/10/2018   Procedure: RE EXCISION SURGICAL SCAR WITH BIOSPY SKIN LESIONS;  Surgeon: Fredirick Maudlin, MD;  Location: [facility];  Service: General;  Laterality: N/A;  . HERNIA REPAIR Right 1976   Inguinal  . lymph node dissecton cervical  10/07/2012  . PLANTAR FASCIA SURGERY Bilateral   . SPINE SURGERY  2019   lumbar  . TOTAL THYROIDECTOMY  1999   [facility]  . TREATMENT FISTULA ANAL  2001  . VASECTOMY      There were no vitals filed for this visit.      Subjective: Patient behavior: (alertness, ability to follow instructions, etc.): pt has a h/o Thyroidectomy w/ XRT ~20 yrs ago w/ recent f/u of excision and XRT of neck mass in 05/2018. Pt reports NO overt s/s of aspiration w/ oral intake but endorsed odynophagia s/p XRT - "it burned my throat". He stated the odynophagia has improved since the beginning of 08/2018 s/p last XRT tx. He reported episodes of "wheezing" and a "smothering" feeling during that timeframe also but stated this has improved(he felt possible irritation post XRT?). Pt  has not been seen by ENT for full assessment. He endorses OSA and uses CPAP; GERD and is on a PPI daily. He denies any change in his diet; denies weight loss. OM exam: WFL. Native dentition; missing few.  Chief complaint: dysphagia   Objective:  Radiological Procedure: A videoflouroscopic evaluation of oral-preparatory, reflex initiation, and pharyngeal phases of the swallow was performed; as well as a screening of the upper esophageal phase.  I. POSTURE: upright II. VIEW: lateral III. COMPENSATORY STRATEGIES: NONE indicated IV. BOLUSES ADMINISTERED:  Thin Liquid: 4 trials  Nectar-thick Liquid: 1 trial  Honey-thick Liquid: NT  Puree: 3 trials  Mechanical Soft: 1 trial V. RESULTS OF EVALUATION: A. ORAL PREPARATORY PHASE: (The lips, tongue, and velum are observed for strength and coordination)       **Overall Severity Rating: WFL. Timely oral phase management and A-P transfer noted w/ all trials; mastication complete; oral clearing post trials complete  B. SWALLOW INITIATION/REFLEX: (The reflex is normal if "triggered" by the time the bolus reached the base of the tongue)  **Overall Severity Rating: [facility]. Timely pharyngeal swallow initiation w/ appropriate airway closure during the swallow; epiglottic movement timely and efficient w/ tight closure noted  C. PHARYNGEAL PHASE: (Pharyngeal function is normal if  the bolus shows rapid, smooth, and continuous transit through the pharynx and there is no pharyngeal residue after the swallow)  **Overall Severity Rating: [facility]. No pharyngeal residue noted in the pharynx post swallows w/ trial consistencies indicating adequate laryngeal excursion and pharyngeal pressure during the swallow  D. LARYNGEAL PENETRATION: (Material entering into the laryngeal inlet/vestibule but not aspirated): NONE E. ASPIRATION: NONE F. ESOPHAGEAL PHASE: (Screening of the upper esophagus): no dysmotility of bolus trials noted in the Cervical Esophagus area viewable(sholders  obscured slightly)    ASSESSMENT: Pt appeared to present w/ No oropharyngeal phase dysphagia - he exhibited a functional, WNL swallow w/ No laryngeal penetration or aspiration during this study. He is at reduced risk for prandial aspiration during the swallow following general aspiration precautions. However, pt does have a baseline of GERD and is at risk for retrograde activity of Reflux material which could impact Pulmonary status. During the oral phase, timely oral phase management and A-P transfer noted w/ all trial consistencies; mastication complete; oral clearing post trials complete. During the pharyngeal phase, timely pharyngeal swallow initiation w/ appropriate airway closure occurred during the swallow; epiglottic movement timely and efficient w/ tight closure noted. No pharyngeal residue noted in the pharynx post trials of all consistencies indicating adequate laryngeal excursion and pharyngeal pressure during the swallow. No evident Esophageal phase dysmotility noted in the cervical esophagus viewable. Pt does have a baseline of GERD.   PLAN/RECOMMENDATIONS:  A. Diet: Regular w/ thin liquids  B. Swallowing Precautions: general Reflux and aspiration precautions  C. Recommended consultation to: f/u w/ ENT for full assessment as desired for odynophagia s/p Radiation txs(neck)  D. Therapy recommendations: NONE  E. Results and recommendations were discussed w/ pt, video viewed, questions answered         Dysphagia, unspecified type - Plan: DG SWALLOW FUNC OP MEDICARE SPEECH PATH, DG SWALLOW FUNC OP MEDICARE SPEECH PATH        Problem List Patient Active Problem List   Diagnosis Date Noted  . 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 Nodule of skin of neck         Orinda Kenner, MS, CCC-SLP Laniece Hornbaker 09/26/2018, 3:15 PM  [facility] [place], [place], [address] Phone: [phone]    Fax:     Name: Chase Ellis MRN: [id number] Date of Birth: Mar 08, 1950

## 2018-10-01 ENCOUNTER — Other Ambulatory Visit: Payer: Self-pay

## 2018-10-02 ENCOUNTER — Ambulatory Visit
Admission: RE | Admit: 2018-10-02 | Discharge: 2018-10-02 | Disposition: A | Discharge status: Home / Self Care | Payer: Medicare Other | Source: Ambulatory Visit | Attending: Radiation Oncology | Admitting: Radiation Oncology

## 2018-10-02 ENCOUNTER — Other Ambulatory Visit: Payer: Self-pay | Admitting: *Deleted

## 2018-10-02 ENCOUNTER — Other Ambulatory Visit: Payer: Self-pay

## 2018-10-02 DIAGNOSIS — E890 Postprocedural hypothyroidism: Secondary | ICD-10-CM | POA: Diagnosis not present

## 2018-10-02 DIAGNOSIS — C799 Secondary malignant neoplasm of unspecified site: Secondary | ICD-10-CM

## 2018-10-02 DIAGNOSIS — C73 Malignant neoplasm of thyroid gland: Secondary | ICD-10-CM

## 2018-10-02 DIAGNOSIS — Z923 Personal history of irradiation: Secondary | ICD-10-CM | POA: Insufficient documentation

## 2018-10-02 DIAGNOSIS — I509 Heart failure, unspecified: Secondary | ICD-10-CM | POA: Insufficient documentation

## 2018-10-02 DIAGNOSIS — C792 Secondary malignant neoplasm of skin: Secondary | ICD-10-CM | POA: Insufficient documentation

## 2018-10-02 DIAGNOSIS — I2510 Atherosclerotic heart disease of native coronary artery without angina pectoris: Secondary | ICD-10-CM | POA: Insufficient documentation

## 2018-10-03 NOTE — Progress Notes (Signed)
Radiation Oncology Follow up Note  Name: Chase Ellis   Date:   10/02/2018 MRN:  [id number] DOB: November 24, 1950    This 68 y.o. male presents to the clinic today for 1 month follow-up status post radiation therapy for skin and neck involvement of metastatic papillary thyroid carcinoma  REFERRING PROVIDER: No ref. provider found  HPI: Patient is a 68 year old male now at 1 month having completed radiation therapy for progressive skin and neck involvement of metastatic papillary thyroid carcinoma.Marland Kitchen  He is seen today in routine follow-up he is an excellent response with almost complete disappearance of skin metastasis in the neck region.  He states in a conversation with his wife that he is having some congestive heart failure I have assured him this is unrelated to radiation since there is no cardiac tissue involved in her treatment fields.  He has a history of left bundle branch block coronary artery calcifications morbid obesity and postoperative hypothyroidism.  COMPLICATIONS OF TREATMENT: none  FOLLOW UP COMPLIANCE: keeps appointments   PHYSICAL EXAM:  BP (!) (P) 145/93 (BP Location: Left Arm, Patient Position: Sitting)   Pulse (P) 94   Temp (P) 98.4 F (36.9 C) (Tympanic)   Resp (P) 16   Wt (P) 288 lb (130.6 kg)   BMI (P) 41.32 kg/m  No evidence of mass or nodularity in the neck region or thyroid bed is noted.  Skin lesions have completely resolved in his neck area.  Well-developed well-nourished patient in NAD. HEENT reveals PERLA, EOMI, discs not visualized.  Oral cavity is clear. No oral mucosal lesions are identified. Neck is clear without evidence of cervical or supraclavicular adenopathy. Lungs are clear to A&P. Cardiac examination is essentially unremarkable with regular rate and rhythm without murmur rub or thrill. Abdomen is benign with no organomegaly or masses noted. Motor sensory and DTR levels are equal and symmetric in the upper and lower extremities. Cranial nerves II through XII  are grossly intact. Proprioception is intact. No peripheral adenopathy or edema is identified. No motor or sensory levels are noted. Crude visual fields are within normal range.  RADIOLOGY RESULTS: No current films to review  PLAN: Present time patient has long cardiac history of assured him none of the radiation therapy has anything to do with his cardiac complaints.  I reviewed his cardiology notes and see nothing in there about side effects of radiation causing his slight increased lower extremity edema.  I have asked to see him back in 3 to 4 months we will order a CT scan with contrast of his head and neck and upper chest prior to that visit.  He continues close follow-up care with [facility].  Patient knows to call with any concerns.  I would like to take this opportunity to thank you for allowing me to participate in the care of your patient.Noreene Filbert, MD

## 2018-10-07 ENCOUNTER — Other Ambulatory Visit: Payer: Self-pay

## 2018-10-07 DIAGNOSIS — Z85850 Personal history of malignant neoplasm of thyroid: Secondary | ICD-10-CM

## 2018-10-07 DIAGNOSIS — C799 Secondary malignant neoplasm of unspecified site: Secondary | ICD-10-CM

## 2018-10-07 DIAGNOSIS — C73 Malignant neoplasm of thyroid gland: Secondary | ICD-10-CM

## 2018-11-14 ENCOUNTER — Other Ambulatory Visit: Payer: Self-pay | Admitting: Radiation Oncology

## 2018-11-18 DIAGNOSIS — I5032 Chronic diastolic (congestive) heart failure: Secondary | ICD-10-CM | POA: Insufficient documentation

## 2018-12-12 ENCOUNTER — Other Ambulatory Visit: Payer: Self-pay

## 2018-12-12 ENCOUNTER — Encounter: Payer: Self-pay | Admitting: General Surgery

## 2018-12-12 ENCOUNTER — Ambulatory Visit (INDEPENDENT_AMBULATORY_CARE_PROVIDER_SITE_OTHER): Payer: Medicare Other | Admitting: General Surgery

## 2018-12-12 VITALS — BP 157/89 | HR 69 | Temp 97.3°F | Resp 14 | Ht 70.0 in | Wt 280.0 lb

## 2018-12-12 DIAGNOSIS — C73 Malignant neoplasm of thyroid gland: Secondary | ICD-10-CM | POA: Diagnosis not present

## 2018-12-12 DIAGNOSIS — Z1211 Encounter for screening for malignant neoplasm of colon: Secondary | ICD-10-CM | POA: Insufficient documentation

## 2018-12-12 NOTE — Progress Notes (Signed)
Patient ID: Chase Ellis, male   DOB: [birth year], 68 y.o.   MRN: [id number]  Chief Complaint  Patient presents with  . Follow-up    thyroid cancer    HPI Chase Ellis is a 68 y.o. male.   He is a 68 year old man who has a history of thyroid cancer.  He initially underwent resection with radioactive iodine treatment with subsequent recurrence in th the strap muscles.  I operated on him for this in 2014.  At that time, his tumor was non-iodine avid and was not producing significant amounts of thyroglobulin.  A shave biopsy of a cutaneous lesion was consistent with metastatic papillary thyroid carcinoma.  I excised this area along with another cutaneous lesion in the patient's surgical scar on Monday, 18 May.  He subsequently underwent external beam radiation.  He did have some oropharyngeal side effects from this.  He was seen by Dr. Clovis Riley at [facility] on October 29.  Labs were done at that time that demonstrated good suppression of his TSH but with elevation of his free T4.  His medications were subsequently adjusted.  She also performed an ultrasound that did not show any concern for persistent or recurrent disease.  He says that he recently also had a chest CT and there have been no changes in the small lung nodules seen previously.  Today, Chase Ellis complains only of some irritation in his throat when he swallows.  He is taking an oral preparation provided by radiation oncology that seems to alleviate this.  He denies any voice changes or other difficulty with swallowing.  He denies any new lumps or bumps in his neck.  He denies any heart palpitations or hand tremors.  He does report mood swings.  His weight has increased and he is frustrated by this.  He says that his cardiologist has recently increased his diuretic dose and this has helped with the water weight gain.  He endorses heat intolerance.  No changes in the texture of his hair, skin, or fingernails.  No diarrhea or constipation.  He is  currently taking 175 mcg of levothyroxine 5 days a week and 200 mcg 2 days a week.    Past Medical History:  Diagnosis Date  . Allergy   . Arthritis    spine  . Cancer [facility]) 2014   Thyroid  . Fatigue   . GERD (gastroesophageal reflux disease)   . Hearing loss   . Hyperlipidemia   . Hypertension   . Sleep apnea    uses CPAP    Past Surgical History:  Procedure Laterality Date  . EXCISION MASS NECK N/A 06/10/2018   Procedure: RE EXCISION SURGICAL SCAR WITH BIOSPY SKIN LESIONS;  Surgeon: Fredirick Maudlin, MD;  Location: [place];  Service: General;  Laterality: N/A;  . HERNIA REPAIR Right 1976   Inguinal  . lymph node dissecton cervical  10/07/2012  . PLANTAR FASCIA SURGERY Bilateral   . SPINE SURGERY  2019   lumbar  . TOTAL THYROIDECTOMY  1999   [facility]  . TREATMENT FISTULA ANAL  2001  . VASECTOMY      Family History  Problem Relation Age of Onset  . Thyroid disease Mother   . Hypertension Mother   . Stroke Father   . Heart attack Father   . Bladder Cancer Father   . Diabetes Father   . Heart attack Sister   . Diabetes Sister   . Hypercholesterolemia Sister   . Hypertension Sister  Social History Social History   Tobacco Use  . Smoking status: Never Smoker  . Smokeless tobacco: Former Network engineer Use Topics  . Alcohol use: Never    Frequency: Never  . Drug use: Never    Allergies  Allergen Reactions  . Other Shortness Of Breath and Swelling    Deer - odor and hair cause swelling and SOB  . Lisinopril Cough  . Valsartan Cough         Current Outpatient Medications  Medication Sig Dispense Refill  . azelastine (ASTELIN) 0.1 % nasal spray U 2 SPRAYS IEN BID    . furosemide (LASIX) 40 MG tablet     . metoprolol succinate (TOPROL-XL) 25 MG 24 hr tablet Take by mouth.    . potassium chloride SA (K-DUR,KLOR-CON) 20 MEQ tablet Take by mouth.    . rosuvastatin (CRESTOR) 5 MG tablet     . SYNTHROID 200 MCG tablet      No  current facility-administered medications for this visit.     Review of Systems Review of Systems  All other systems reviewed and are negative. Or as discussed in the history of present illness  Blood pressure (!) 157/89, pulse 69, temperature (!) 97.3 F (36.3 C), temperature source Temporal, resp. rate 14, height 5\' 10"  (1.778 m), weight 280 lb (127 kg), SpO2 96 %.  Physical Exam Physical Exam Constitutional:      General: He is not in acute distress.    Appearance: He is obese.  HENT:     Head: Normocephalic and atraumatic.     Nose:     Comments: Covered with a mask secondary to COVID-19 precautions    Mouth/Throat:     Comments: Covered with a mask secondary to COVID-19 precautions Eyes:     General: No scleral icterus.       Right eye: No discharge.        Left eye: No discharge.     Comments: No proptosis or exophthalmos  Neck:     Comments: Thyroid is surgically absent.  There are scars from his total thyroidectomy, recurrence resection, and skin lesion excisions.  There is no palpable cervical or supraclavicular lymphadenopathy.  The trachea is midline.  There are skin changes present, reflective of external beam radiation therapy. Cardiovascular:     Rate and Rhythm: Normal rate and regular rhythm.  Pulmonary:     Effort: Pulmonary effort is normal.     Breath sounds: Normal breath sounds.  Abdominal:     General: Bowel sounds are normal.     Palpations: Abdomen is soft.     Comments: Protuberant, consistent with his level of obesity.  Genitourinary:    Comments: Deferred Musculoskeletal:     Comments: Trace pretibial edema.  Skin:    General: Skin is warm and dry.  Neurological:     General: No focal deficit present.     Mental Status: He is alert and oriented to person, place, and time.  Psychiatric:        Behavior: Behavior normal.        Thought Content: Thought content normal.     Comments: His affect is flat but this is typical for him.     Data  Reviewed I reviewed the recent notes and labs from [facility].  Labs were obtained on November 05, 2018.  Dr. March Rummage adjusted his thyroid medication upon add free T4 of 1.8.  TSH was undetectable.  Thyroglobulin was 0.1 with undetectable  thyroglobulin antibodies.  Dr. March Rummage also performed a bedside ultrasound.  The report is copied here:  Date: 10/29/20202 Procedure: Dedicated Thyroid US and Lymph Node Mapping Physician: Baxter Kail, MD Location: [facility] Indication: history of recurrent papillary thyroid carcinoma Comparison: Radiology report from 05/30/2018  Procedure note:  Using physician performed, real-time ultrasound with a linear probe  limited to the thyroid gland and anterior neck, longitudinal and  transverse images were obtained of both lobes and isthmus.   Using physician performed, real-time ultrasound with a linear probe  limited to the thyroid bed and anterior neck, including nodes (Levels, I,  II, III, IV, V, and VI), longitudinal and transverse images were obtained.    Overall the gland is surgically absent without evidence of disease  recurrence in the thyroid bed.  No nodule seen.  All measurements are expressed as: sagittal x anterior to posterior x  transverse, unless otherwise specified.    Lymph Node Assessment:  Right Level 3. There is a rounded reactive appearing lymph node measuring:  0.55 x 0.47 x 0.62 cm, previously 1.2 x 0.29 x 0.75 cm with normal fatty  hila and normal flow. No calcifications seen  There are no additional lymph nodes seen in the left or right neck levels  II-VI including VA/VB   Impressions: 1. Surgically absent thyroid gland 2. No evidence of disease recurrence in the thyroid bed 3. Right level 3, maximum dimension 0.62 cm rounded reactive lymph node  without calcifications.  Recommendations:  1. Serial monitoring of right level 3 lymph node recommended 2. No indication for  FNA at this time 3. Correlate with thyroglobulin level    I personally obtained the above ultrasound images. All images are being  permanently stored in the electronic medical record system.   Electronically signed by: Jillyn Hidden, MD 11/21/2018 2:29 PM  Assessment This is a 68 year old man who had a total thyroidectomy followed by radioactive iodine ablation.  He subsequently had a recurrence but was noniodine avid.  This was resected.  He recently had a skin lesion that was consistent with cutaneous metastasis.  This was resected and he was treated with external beam radiation.  Overall, he is well managed from a thyroid cancer standpoint.  Dr. March Rummage is doing an excellent job of monitoring his lab values and performing surveillance ultrasounds.   He and his wife did bring up his mood swings and asked if these were related to his thyroid.  I discussed with them that it was unlikely, but that he has had quite a few health issues over the past year or so, in addition to the stress many people are feeling regarding COVID.  I encouraged him to discuss this further with his PCP, Benancio Deeds, and that there were a number of options to help him address what sounds to me like situational depression.  Plan At this time, he does not require the additional care of endocrine surgery, as this would simply be duplication of the effort already being put forth by Dr. March Rummage.  He does live closer to [facility] and this will also save on travel time.  I have reassured both Chase Ellis and his wife, that should he require any further surgical intervention, I am more than happy to assist him in this regard.  I will see him back on an as-needed basis.    Fredirick Maudlin 12/12/2018, 11:57 AM

## 2018-12-12 NOTE — Patient Instructions (Signed)
Ease call our office if you have any questions or concerns.

## 2018-12-30 ENCOUNTER — Ambulatory Visit: Admission: RE | Admit: 2018-12-30 | Payer: Medicare Other | Source: Ambulatory Visit

## 2018-12-30 ENCOUNTER — Ambulatory Visit: Payer: Medicare Other

## 2018-12-30 ENCOUNTER — Other Ambulatory Visit: Payer: Medicare Other

## 2019-01-06 ENCOUNTER — Ambulatory Visit: Payer: Medicare Other | Admitting: Radiation Oncology

## 2019-01-09 ENCOUNTER — Ambulatory Visit: Admission: RE | Admit: 2019-01-09 | Payer: Medicare Other | Source: Ambulatory Visit

## 2019-01-20 ENCOUNTER — Other Ambulatory Visit: Payer: Self-pay

## 2019-01-21 ENCOUNTER — Ambulatory Visit
Admission: RE | Admit: 2019-01-21 | Discharge: 2019-01-21 | Disposition: A | Discharge status: Home / Self Care | Payer: Medicare Other | Source: Ambulatory Visit | Attending: Radiation Oncology | Admitting: Radiation Oncology

## 2019-01-21 ENCOUNTER — Encounter: Payer: Self-pay | Admitting: Radiation Oncology

## 2019-01-21 ENCOUNTER — Other Ambulatory Visit: Payer: Self-pay

## 2019-01-21 VITALS — BP 159/86 | HR 74 | Temp 97.9°F | Wt 282.5 lb

## 2019-01-21 DIAGNOSIS — Z85850 Personal history of malignant neoplasm of thyroid: Secondary | ICD-10-CM | POA: Diagnosis not present

## 2019-01-21 DIAGNOSIS — C73 Malignant neoplasm of thyroid gland: Secondary | ICD-10-CM

## 2019-01-21 DIAGNOSIS — C799 Secondary malignant neoplasm of unspecified site: Secondary | ICD-10-CM

## 2019-01-21 DIAGNOSIS — Z923 Personal history of irradiation: Secondary | ICD-10-CM | POA: Insufficient documentation

## 2019-01-21 NOTE — Progress Notes (Signed)
Radiation Oncology Follow up Note  Name: Chase Ellis   Date:   01/21/2019 MRN:  [id number] DOB: Nov 14, 1950    This 68 y.o. male presents to the clinic today for 24-month follow-up status post external beam radiation therapy for skin and neck involvement of metastatic papillary thyroid carcinoma.  REFERRING PROVIDER: No ref. provider found  HPI: Patient is a 68 year old male now seen out.  4 months having completed external beam radiation therapy for metastatic papillary thyroid carcinoma to skin and neck involvement.  He is seen today and is doing well.  Specifically denies head and neck pain dysphagia.  His skin lesions have completely resolved.  His thyroglobulin levels according to [facility] are absolutely stable if not slightly declined.  He had a recent CT scan of both the chest and thyroid bed.  Chest CT showed minimal changes with no appreciable change in size and number of multiple nonspecific small less than 36-month millimeter nodules in the base of both lungs.  His CT scan of the neck showed some stranding although no definitive evidence of progressive disease.  Patient also had back in October ultrasound at [facility] showing no evidence of disease.  COMPLICATIONS OF TREATMENT: none  FOLLOW UP COMPLIANCE: keeps appointments   PHYSICAL EXAM:  BP (!) 159/86   Pulse 74   Temp 97.9 F (36.6 C)   Wt 282 lb 8 oz (128.1 kg)   BMI 40.53 kg/m  Skin lesions in the neck and upper chest have completely resolved.  No evidence of mass or nodularity on physical exam in the thyroid bed or cervical chain is noted.  Supraglottic regions are clear.  Well-developed well-nourished patient in NAD. HEENT reveals PERLA, EOMI, discs not visualized.  Oral cavity is clear. No oral mucosal lesions are identified. Neck is clear without evidence of cervical or supraclavicular adenopathy. Lungs are clear to A&P. Cardiac examination is essentially unremarkable with regular rate and rhythm without murmur rub or  thrill. Abdomen is benign with no organomegaly or masses noted. Motor sensory and DTR levels are equal and symmetric in the upper and lower extremities. Cranial nerves II through XII are grossly intact. Proprioception is intact. No peripheral adenopathy or edema is identified. No motor or sensory levels are noted. Crude visual fields are within normal range.  RADIOLOGY RESULTS: CT scans are reviewed compatible with above-stated findings  PLAN: Present time patient is stable with no evidence of disease by CT scan.  Or ultrasound criteria.  His thyroglobulin levels are stable.  I am pleased with his overall progress.  He continues close follow-up care with endocrinology at [facility].  I have asked to see him back in 6 months for follow-up.  Patient knows to call with any concerns at any time.  I would like to take this opportunity to thank you for allowing me to participate in the care of your patient.Noreene Filbert, MD

## 2019-02-09 ENCOUNTER — Other Ambulatory Visit: Payer: Self-pay | Admitting: Radiation Oncology

## 2019-02-12 ENCOUNTER — Other Ambulatory Visit: Payer: Self-pay | Admitting: *Deleted

## 2019-07-17 ENCOUNTER — Ambulatory Visit
Admission: RE | Admit: 2019-07-17 | Discharge: 2019-07-17 | Disposition: A | Discharge status: Home / Self Care | Payer: Medicare Other | Source: Ambulatory Visit | Attending: Radiation Oncology | Admitting: Radiation Oncology

## 2019-07-17 ENCOUNTER — Other Ambulatory Visit: Payer: Self-pay

## 2019-07-17 VITALS — BP 151/87 | HR 69 | Temp 98.0°F | Resp 16 | Wt 285.7 lb

## 2019-07-17 DIAGNOSIS — Z923 Personal history of irradiation: Secondary | ICD-10-CM | POA: Insufficient documentation

## 2019-07-17 DIAGNOSIS — Z85820 Personal history of malignant melanoma of skin: Secondary | ICD-10-CM | POA: Insufficient documentation

## 2019-07-17 DIAGNOSIS — C799 Secondary malignant neoplasm of unspecified site: Secondary | ICD-10-CM

## 2019-07-17 DIAGNOSIS — R918 Other nonspecific abnormal finding of lung field: Secondary | ICD-10-CM | POA: Insufficient documentation

## 2019-07-17 NOTE — Progress Notes (Signed)
Radiation Oncology Follow up Note  Name: Chase Ellis   Date:   07/17/2019 MRN:  [id number] DOB: 1950-11-06    This 69 y.o. male presents to the clinic today for 41-month follow-up status post external beam radiation therapy for skin and neck involvement of metastatic papillary thyroid cancer.  REFERRING PROVIDER: Fredirick Maudlin, MD  HPI: Patient is a 69 year old male now out 10 months having completed external beam radiation therapy to his skin of his neck as well as neck involvement of metastatic papillary thyroid cancer.  He is seen today in routine follow-up doing well specifically [place] any dysphagia cough or head and neck pain.  He had a recent CT scan as well as ultrasound showing no evidence of disease.  He has stable small pulmonary nodules..  COMPLICATIONS OF TREATMENT: none  FOLLOW UP COMPLIANCE: keeps appointments   PHYSICAL EXAM:  BP (!) 151/87   Pulse 69   Temp 98 F (36.7 C)   Resp 16   Wt 285 lb 11.2 oz (129.6 kg)   SpO2 97%   BMI 40.99 kg/m  Neck is clear without evidence of cervical or supraclavicular adenopathy or nodularity.  Skin looks fine with no evidence of disease.  Well-developed well-nourished patient in NAD. HEENT reveals PERLA, EOMI, discs not visualized.  Oral cavity is clear. No oral mucosal lesions are identified. Neck is clear without evidence of cervical or supraclavicular adenopathy. Lungs are clear to A&P. Cardiac examination is essentially unremarkable with regular rate and rhythm without murmur rub or thrill. Abdomen is benign with no organomegaly or masses noted. Motor sensory and DTR levels are equal and symmetric in the upper and lower extremities. Cranial nerves II through XII are grossly intact. Proprioception is intact. No peripheral adenopathy or edema is identified. No motor or sensory levels are noted. Crude visual fields are within normal range. RADIOLOGY RESULTS: CT scans reviewed compatible with above-stated findings  PLAN: Present time  patient is now 69-month out with no evidence of disease.  And pleased with his overall progress.  Of asked to see him back in 1 year for follow-up.  He continues close follow-up care with [facility].  Patient knows to call with any concerns.      Noreene Filbert, MD

## 2020-04-14 ENCOUNTER — Other Ambulatory Visit: Payer: Self-pay | Admitting: Radiation Oncology

## 2020-07-15 ENCOUNTER — Encounter: Payer: Self-pay | Admitting: Radiation Oncology

## 2020-07-15 ENCOUNTER — Other Ambulatory Visit: Payer: Self-pay

## 2020-07-15 ENCOUNTER — Ambulatory Visit
Admission: RE | Admit: 2020-07-15 | Discharge: 2020-07-15 | Disposition: A | Discharge status: Home / Self Care | Payer: Medicare Other | Source: Ambulatory Visit | Attending: Radiation Oncology | Admitting: Radiation Oncology

## 2020-07-15 DIAGNOSIS — Z923 Personal history of irradiation: Secondary | ICD-10-CM | POA: Insufficient documentation

## 2020-07-15 DIAGNOSIS — C73 Malignant neoplasm of thyroid gland: Secondary | ICD-10-CM | POA: Diagnosis present

## 2020-07-15 DIAGNOSIS — Z85850 Personal history of malignant neoplasm of thyroid: Secondary | ICD-10-CM | POA: Diagnosis not present

## 2020-07-15 DIAGNOSIS — Z85820 Personal history of malignant melanoma of skin: Secondary | ICD-10-CM | POA: Insufficient documentation

## 2020-07-15 DIAGNOSIS — R918 Other nonspecific abnormal finding of lung field: Secondary | ICD-10-CM | POA: Insufficient documentation

## 2020-07-15 DIAGNOSIS — C799 Secondary malignant neoplasm of unspecified site: Secondary | ICD-10-CM

## 2020-07-15 NOTE — Progress Notes (Signed)
Radiation Oncology Follow up Note  Name: Chase Ellis   Date:   07/15/2020 MRN:  [id number] DOB: [birth year]    This 70 y.o. male presents to the clinic today for 2-year follow-up status post external beam radiation therapy for skin and neck involvement of metastatic papillary thyroid carcinoma.  REFERRING PROVIDER: No ref. provider found  HPI: Patient is a 70 year old male now out 2 years having completed external beam radiation therapy for skin and neck involvement of metastatic papillary thyroid carcinoma seen today in routine follow-up he is doing well specifically denies any head and neck pain or dysphagia.  He is followed at [facility].  With CT scan performed last December showing similar size and appearance of anterior left lower lobe solid pulmonary nodule and multiple bilateral pulmonary micronodules with no evidence of enlarging pulmonary nodules.  He also had the same time ultrasound of the head and neck showed no evidence of local recurrence in the thyroid bed and no definitive evidence to suggest spread to cervical lymph nodes or soft tissue.  COMPLICATIONS OF TREATMENT: none  FOLLOW UP COMPLIANCE: keeps appointments   PHYSICAL EXAM:  BP (!) (P) 141/82 (BP Location: Left Arm, Patient Position: Sitting)   Pulse (P) 67   Temp (!) (P) 97.2 F (36.2 C) (Tympanic)   Resp (P) 18   Wt (P) 280 lb 6.4 oz (127.2 kg)   BMI (P) 40.23 kg/m  Patient is no evidence of skin metastasis.  Neck is clear without evidence of cervical or supraclavicular adenopathy.  Well-developed well-nourished patient in NAD. HEENT reveals PERLA, EOMI, discs not visualized.  Oral cavity is clear. No oral mucosal lesions are identified. Neck is clear without evidence of cervical or supraclavicular adenopathy. Lungs are clear to A&P. Cardiac examination is essentially unremarkable with regular rate and rhythm without murmur rub or thrill. Abdomen is benign with no organomegaly or masses noted. Motor sensory and DTR  levels are equal and symmetric in the upper and lower extremities. Cranial nerves II through XII are grossly intact. Proprioception is intact. No peripheral adenopathy or edema is identified. No motor or sensory levels are noted. Crude visual fields are within normal range.  RADIOLOGY RESULTS: CT scans and ultrasound reviewed  PLAN: Present time patient continues to do well with no evidence of disease.  He is having follow-up CT [facility] to keep an eye on these pulmonary nodules.  I am otherwise pleased with his overall progress.  I have asked to see the patient back in 1 year for follow-up.  Patient and wife know to call with any concerns.  I would like to take this opportunity to thank you for allowing me to participate in the care of your patient.Noreene Filbert, MD

## 2020-11-09 ENCOUNTER — Encounter: Payer: Self-pay | Admitting: General Surgery

## 2021-04-05 ENCOUNTER — Other Ambulatory Visit: Payer: Self-pay | Admitting: Radiation Oncology

## 2021-04-18 IMAGING — CT CT CHEST WITH CONTRAST
2 of 4 series · 15 of 36 positions shown, 18 images · IV contrast (omnipaque)
Comparison: PET-CT 05/09/2018.

CLINICAL DATA: 67-year-old male with history of thyroid cancer
status post thyroidectomy, currently undergoing radiation therapy.
Occasional shortness of breath.

EXAM:
CT CHEST WITH CONTRAST
TECHNIQUE: Multidetector CT imaging of the chest was performed during
intravenous contrast administration.
CONTRAST:  75mL OMNIPAQUE IOHEXOL 300 MG/ML  SOLN

[Series 2: axial chest · axial · 0.95mm/px · z∈[-1275,-915]mm · 12 of 214 slices shown, 15 images]
[im 17/214  mediastinal]
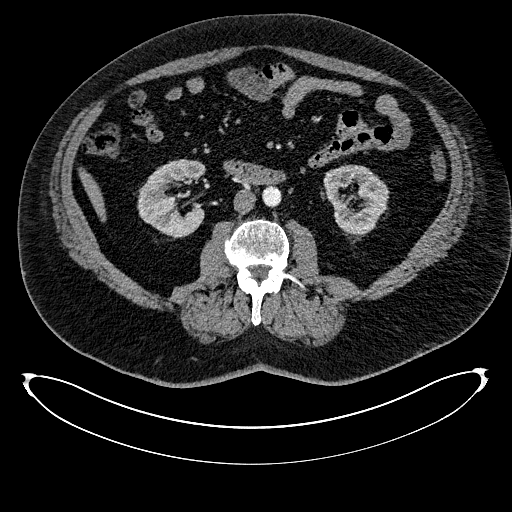
[im 17/214  lung]
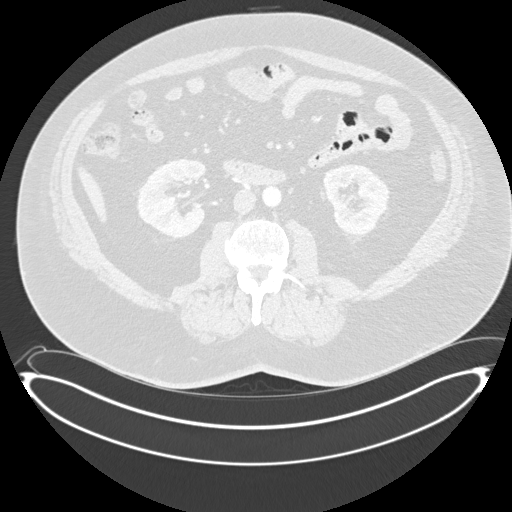
[im 33/214  lung]
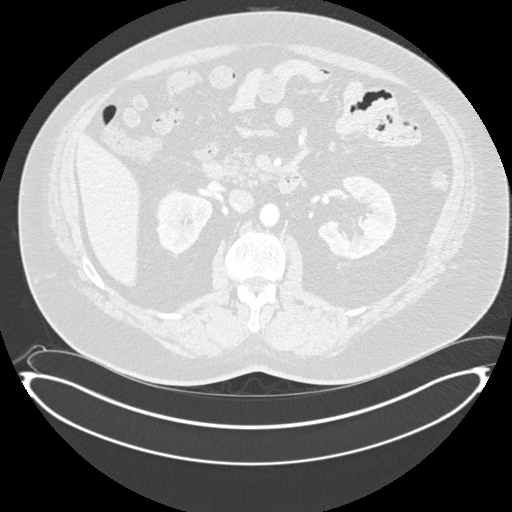
[im 50/214  lung]
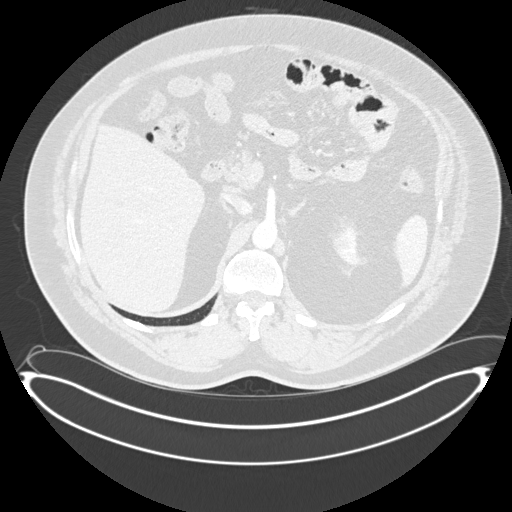
[im 66/214  lung]
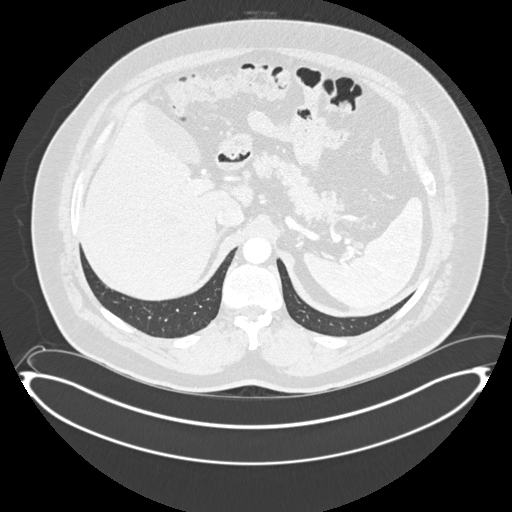
[im 82/214  mediastinal]
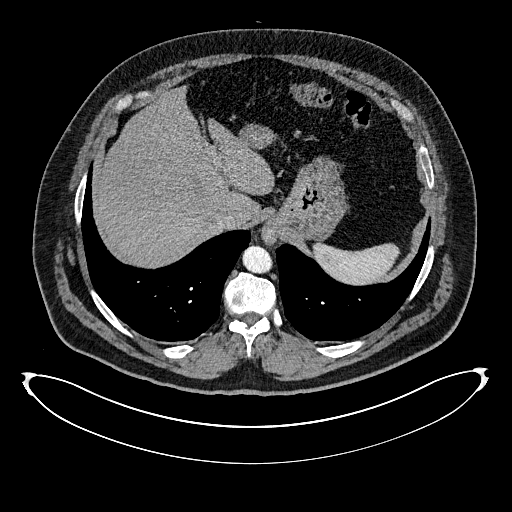
[im 82/214  lung]
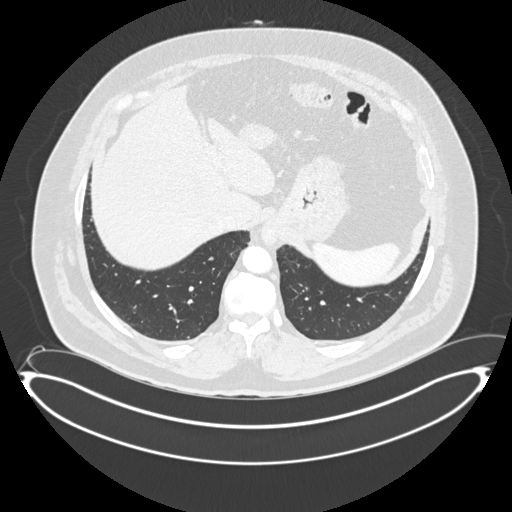
[im 99/214  lung]
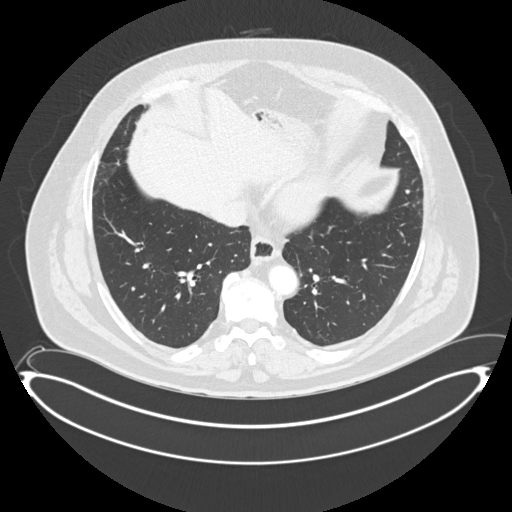
[im 115/214  lung]
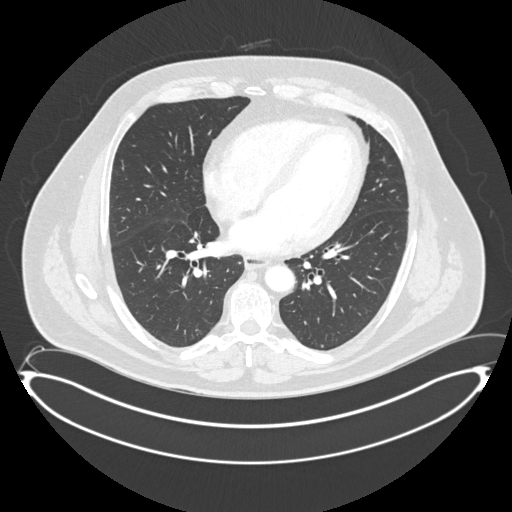
[im 132/214  lung]
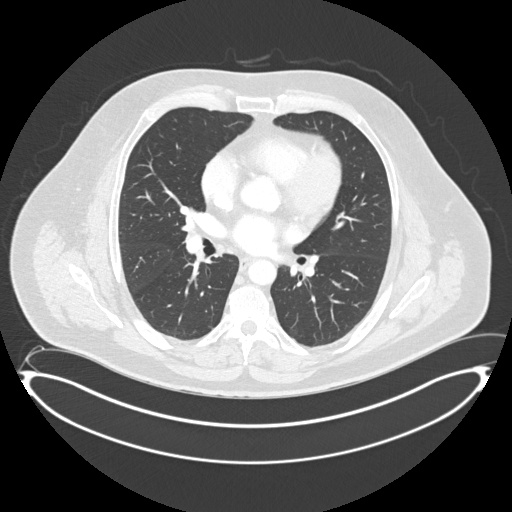
[im 148/214  mediastinal]
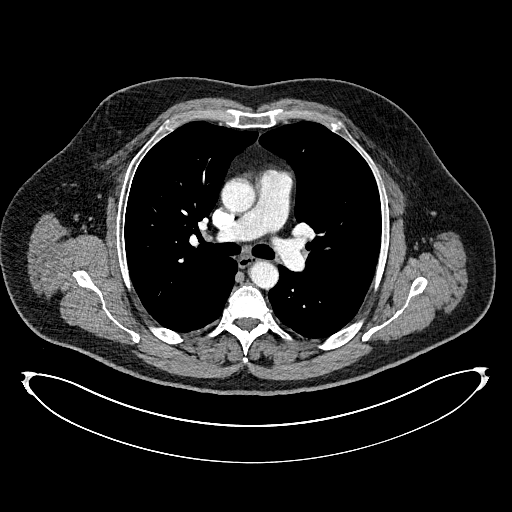
[im 148/214  lung]
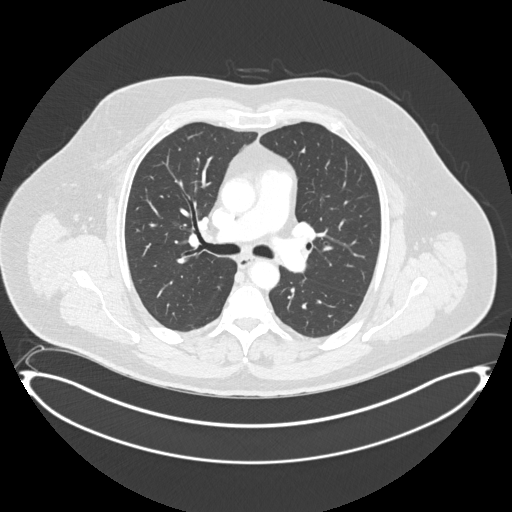
[im 164/214  lung]
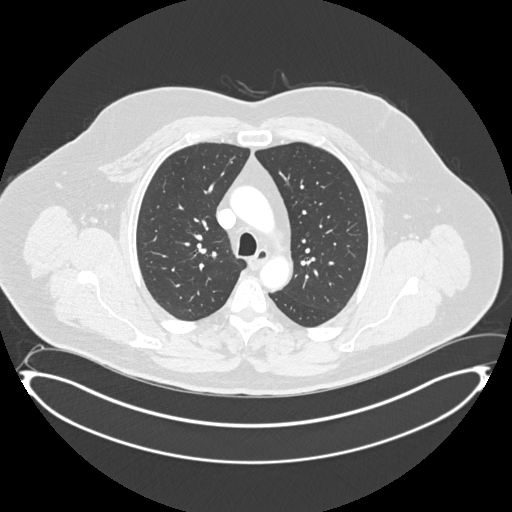
[im 181/214  lung]
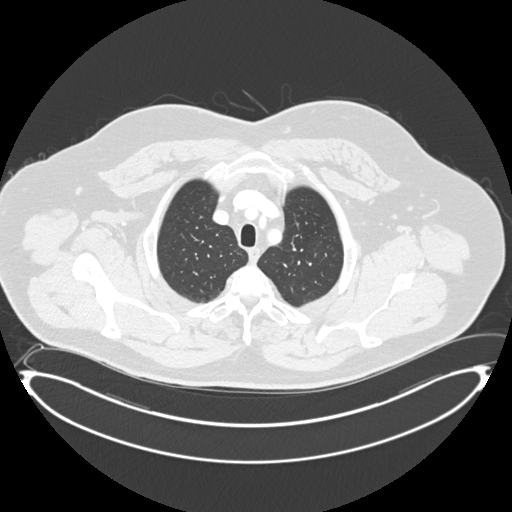
[im 197/214  lung]
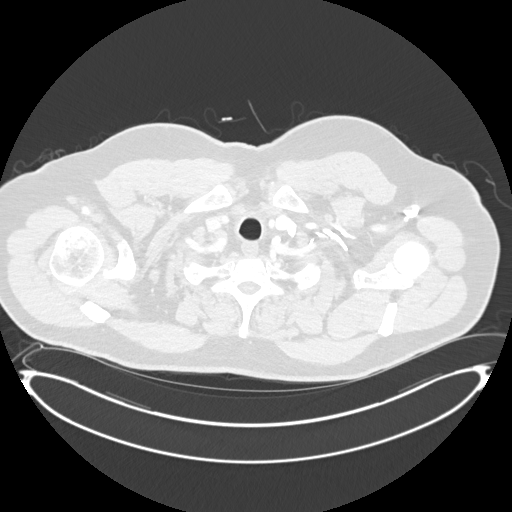

[Series 4: coronal chest · coronal · 0.84mm/px · 3 of 184 slices shown]
[im 37/184  lung]
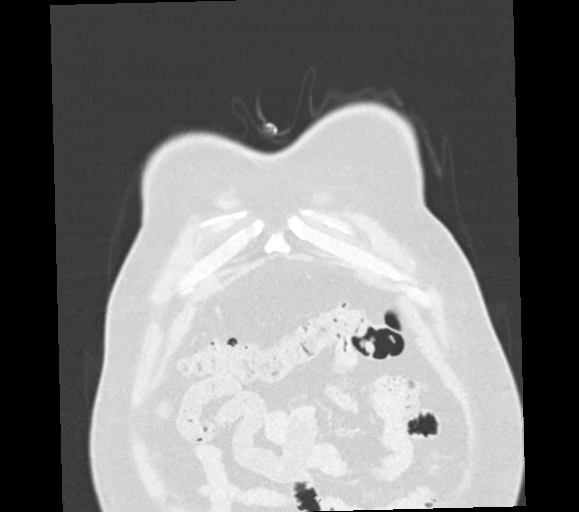
[im 74/184  lung]
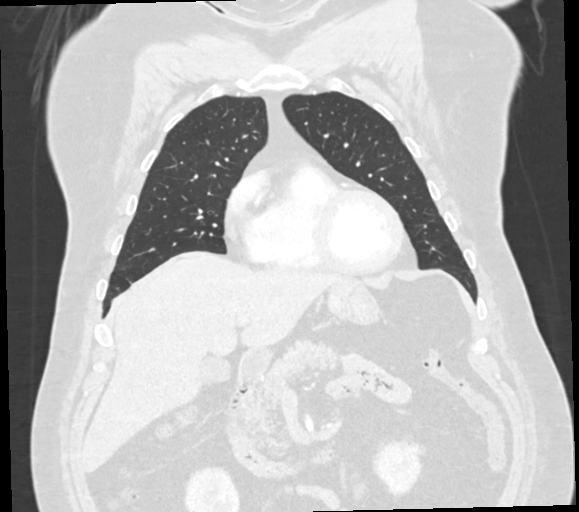
[im 110/184  lung]
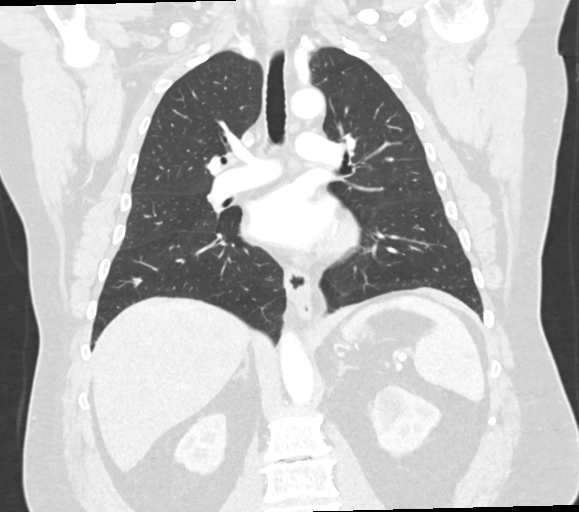

[15 of 36 positions shown; findings below may reference images not displayed]

FINDINGS: Cardiovascular: Heart size is normal. There is no significant
pericardial fluid, thickening or pericardial calcification. There is
aortic atherosclerosis, as well as atherosclerosis of the great
vessels of the mediastinum and the coronary arteries, including
calcified atherosclerotic plaque in the left main, left anterior
descending, left circumflex and right coronary arteries.

Mediastinum/Nodes: No pathologically enlarged mediastinal or hilar
lymph nodes. Esophagus is unremarkable in appearance. No axillary
lymphadenopathy.

Lungs/Pleura: Multiple tiny pulmonary nodules scattered throughout
the lungs bilaterally measuring 4 mm or less in size. No larger more
suspicious appearing pulmonary nodules or masses are noted. Many of
these nodules appear stable compared to the prior examination,
although direct comparison is challenging secondary to motion on the
prior study and thicker slice collimation (2 mm on today's exam
versus 5 mm on the prior). No acute consolidative airspace disease.
No pleural effusions.

Upper Abdomen: Aortic atherosclerosis. 1.9 cm low-attenuation lesion
in the upper pole the left kidney, compatible with a simple cyst.

Musculoskeletal: There are no aggressive appearing lytic or blastic
lesions noted in the visualized portions of the skeleton.
IMPRESSION: 1. Multiple tiny pulmonary nodules measuring 4 mm or less scattered
throughout the lungs bilaterally. These are nonspecific, and
typically nodules of this size are statistically benign. However,
given the patient's history of thyroid cancer, close attention on
future follow-up examinations is recommended to exclude the
possibility of metastatic disease.
2. Aortic atherosclerosis, in addition to left main and 3 vessel
coronary artery disease. Please note that although the presence of
coronary artery calcium documents the presence of coronary artery
disease, the severity of this disease and any potential stenosis
cannot be assessed on this non-gated CT examination. Assessment for
potential risk factor modification, dietary therapy or pharmacologic
therapy may be warranted, if clinically indicated.

Aortic Atherosclerosis (037DI-62L.L).

## 2021-04-26 ENCOUNTER — Other Ambulatory Visit: Payer: Self-pay | Admitting: *Deleted

## 2021-04-26 MED ORDER — PANTOPRAZOLE SODIUM 40 MG PO TBEC: DELAYED_RELEASE_TABLET | ORAL | 11 refills | Status: DC

## 2021-07-10 ENCOUNTER — Other Ambulatory Visit: Payer: Self-pay | Admitting: Radiation Oncology

## 2021-07-14 ENCOUNTER — Ambulatory Visit
Admission: RE | Admit: 2021-07-14 | Discharge: 2021-07-14 | Disposition: A | Discharge status: Home / Self Care | Payer: Medicare Other | Source: Ambulatory Visit | Attending: Radiation Oncology | Admitting: Radiation Oncology

## 2021-07-14 ENCOUNTER — Encounter: Payer: Self-pay | Admitting: Radiation Oncology

## 2021-07-14 VITALS — BP 140/83 | HR 65 | Temp 97.4°F | Resp 16 | Wt 271.3 lb

## 2021-07-14 DIAGNOSIS — D696 Thrombocytopenia, unspecified: Secondary | ICD-10-CM | POA: Diagnosis not present

## 2021-07-14 DIAGNOSIS — Z923 Personal history of irradiation: Secondary | ICD-10-CM | POA: Diagnosis not present

## 2021-07-14 DIAGNOSIS — R918 Other nonspecific abnormal finding of lung field: Secondary | ICD-10-CM | POA: Diagnosis not present

## 2021-07-14 DIAGNOSIS — Z85850 Personal history of malignant neoplasm of thyroid: Secondary | ICD-10-CM | POA: Insufficient documentation

## 2021-07-14 DIAGNOSIS — C73 Malignant neoplasm of thyroid gland: Secondary | ICD-10-CM

## 2021-07-14 NOTE — Progress Notes (Signed)
Radiation Oncology Follow up Note  Name: Chase Ellis   Date:   07/14/2021 MRN:  [id number] DOB: 06-01-1950    This 71 y.o. male presents to the clinic today for 3-year follow-up status post external beam radiation therapy who is neck and thyroid bed for metastatic involvement of the skin with papillary thyroid carcinoma.  REFERRING PROVIDER: No ref. provider found  HPI: Patient is a 71 year old male now out 3 years having completed radiation therapy with external beam to his prostate bed as well as skin for involvement of metastatic papillary thyroid cancer.  Seen today in routine follow-up clinically he is doing well.  He does have some documented small less than 1 cm lung nodules consistent with metastatic disease.  Also recently has been evaluated for thrombocytosis.Marland Kitchen  He had a CT scan back in April showing multiple less than a centimeter lesions of the right middle right lower and left lower lobes consistent with metastatic disease.  These lesions are overall the same size and similar distribution to previous exam of 22 CT scan of the chest.  COMPLICATIONS OF TREATMENT: none  FOLLOW UP COMPLIANCE: keeps appointments   PHYSICAL EXAM:  BP 140/83 (BP Location: Right Arm, Patient Position: Sitting, Cuff Size: Normal)   Pulse 65   Temp (!) 97.4 F (36.3 C) (Tympanic)   Resp 16   Wt 271 lb 4.8 oz (123.1 kg)   BMI 38.93 kg/m  Patient has some fibrosis of his neck secondary radiation.  No evidence of skin metastasis no evidence of cervical or supraclavicular adenopathy prostatic bed is clear.  Well-developed well-nourished patient in NAD. HEENT reveals PERLA, EOMI, discs not visualized.  Oral cavity is clear. No oral mucosal lesions are identified. Neck is clear without evidence of cervical or supraclavicular adenopathy. Lungs are clear to A&P. Cardiac examination is essentially unremarkable with regular rate and rhythm without murmur rub or thrill. Abdomen is benign with no organomegaly or masses  noted. Motor sensory and DTR levels are equal and symmetric in the upper and lower extremities. Cranial nerves II through XII are grossly intact. Proprioception is intact. No peripheral adenopathy or edema is identified. No motor or sensory levels are noted. Crude visual fields are within normal range.  RADIOLOGY RESULTS: CT scan reports reviewed  PLAN: At this time patient continues under close observation at [facility] by medical oncology.  I have asked to see him back in 1 year.  I be happy to reevaluate the patient patient anytime should further palliative treatment be indicated.  I agree with observation at this time.  I would like to take this opportunity to thank you for allowing me to participate in the care of your patient.Noreene Filbert, MD

## 2022-04-07 ENCOUNTER — Other Ambulatory Visit: Payer: Self-pay | Admitting: Radiation Oncology

## 2022-04-15 ENCOUNTER — Other Ambulatory Visit: Payer: Self-pay | Admitting: Radiation Oncology

## 2022-04-21 ENCOUNTER — Other Ambulatory Visit: Payer: Self-pay | Admitting: *Deleted

## 2022-04-21 ENCOUNTER — Other Ambulatory Visit: Payer: Self-pay | Admitting: Radiation Oncology

## 2022-07-20 ENCOUNTER — Ambulatory Visit: Payer: Medicare Other | Attending: Radiation Oncology | Admitting: Radiation Oncology

## 2023-05-27 ENCOUNTER — Other Ambulatory Visit: Payer: Self-pay | Admitting: Radiation Oncology

## 2023-05-28 ENCOUNTER — Other Ambulatory Visit: Payer: Self-pay | Admitting: Radiation Oncology

## 2023-06-01 ENCOUNTER — Telehealth: Payer: Self-pay | Admitting: *Deleted

## 2023-06-01 NOTE — Telephone Encounter (Signed)
 Called patient regarding pantoprazole  refill. Patient is no longer under the care of Dr. Jacalyn Martin advised patient to contact PCP for management of refill.
